# Patient Record
Sex: Female | Born: 1980 | Race: White | Hispanic: No | Marital: Married | Smoking: Never smoker
Health system: Southern US, Community
[De-identification: ages and names within clinical notes are randomized; demographics above are authoritative.]

## PROBLEM LIST (undated history)

## (undated) ENCOUNTER — Inpatient Hospital Stay (HOSPITAL_COMMUNITY): Payer: Self-pay

## (undated) DIAGNOSIS — F419 Anxiety disorder, unspecified: Secondary | ICD-10-CM

## (undated) DIAGNOSIS — Z8619 Personal history of other infectious and parasitic diseases: Secondary | ICD-10-CM

## (undated) DIAGNOSIS — K219 Gastro-esophageal reflux disease without esophagitis: Secondary | ICD-10-CM

## (undated) HISTORY — PX: WISDOM TOOTH EXTRACTION: SHX21

## (undated) HISTORY — DX: Personal history of other infectious and parasitic diseases: Z86.19

---

## 2010-12-04 LAB — ANTIBODY SCREEN: Antibody Screen: NEGATIVE

## 2010-12-04 LAB — GC/CHLAMYDIA PROBE AMP, GENITAL
Chlamydia: NEGATIVE
Gonorrhea: NEGATIVE

## 2010-12-04 LAB — CBC
HCT: 41 % (ref 36–46)
Hemoglobin: 13.5 g/dL (ref 12.0–16.0)

## 2010-12-04 LAB — ABO/RH: RH Type: POSITIVE

## 2010-12-04 LAB — HIV ANTIBODY (ROUTINE TESTING W REFLEX): HIV: NONREACTIVE

## 2011-06-14 ENCOUNTER — Inpatient Hospital Stay (HOSPITAL_COMMUNITY): Admission: AD | Admit: 2011-06-14 | Payer: Self-pay | Source: Ambulatory Visit | Admitting: Obstetrics and Gynecology

## 2011-07-02 ENCOUNTER — Encounter (HOSPITAL_COMMUNITY): Payer: Self-pay | Admitting: *Deleted

## 2011-07-02 ENCOUNTER — Telehealth (HOSPITAL_COMMUNITY): Payer: Self-pay | Admitting: *Deleted

## 2011-07-02 NOTE — Telephone Encounter (Signed)
Preadmission screen  

## 2011-07-05 ENCOUNTER — Encounter (HOSPITAL_COMMUNITY): Payer: Self-pay | Admitting: *Deleted

## 2011-07-08 ENCOUNTER — Inpatient Hospital Stay (HOSPITAL_COMMUNITY)
Admission: RE | Admit: 2011-07-08 | Discharge: 2011-07-12 | DRG: 371 | Disposition: A | Discharge status: Home / Self Care | Payer: BC Managed Care – PPO | Source: Ambulatory Visit | Attending: Obstetrics and Gynecology | Admitting: Obstetrics and Gynecology

## 2011-07-08 ENCOUNTER — Inpatient Hospital Stay (HOSPITAL_COMMUNITY): Payer: BC Managed Care – PPO

## 2011-07-08 ENCOUNTER — Encounter (HOSPITAL_COMMUNITY): Payer: Self-pay

## 2011-07-08 DIAGNOSIS — O330 Maternal care for disproportion due to deformity of maternal pelvic bones: Secondary | ICD-10-CM | POA: Diagnosis present

## 2011-07-08 DIAGNOSIS — O480 Post-term pregnancy: Principal | ICD-10-CM | POA: Diagnosis present

## 2011-07-08 DIAGNOSIS — O4100X0 Oligohydramnios, unspecified trimester, not applicable or unspecified: Secondary | ICD-10-CM | POA: Diagnosis present

## 2011-07-08 DIAGNOSIS — O339 Maternal care for disproportion, unspecified: Secondary | ICD-10-CM | POA: Diagnosis present

## 2011-07-08 LAB — CBC
HCT: 39.2 % (ref 36.0–46.0)
Hemoglobin: 13.1 g/dL (ref 12.0–15.0)
MCH: 30.8 pg (ref 26.0–34.0)
MCHC: 33.4 g/dL (ref 30.0–36.0)
MCV: 92.2 fL (ref 78.0–100.0)

## 2011-07-08 MED ORDER — ONDANSETRON HCL 4 MG/2ML IJ SOLN: 4.0000 mg | Freq: Four times a day (QID) | INTRAMUSCULAR | Status: DC | PRN

## 2011-07-08 MED ORDER — IBUPROFEN 600 MG PO TABS: 600.0000 mg | ORAL_TABLET | Freq: Four times a day (QID) | ORAL | Status: DC | PRN

## 2011-07-08 MED ORDER — OXYTOCIN 20 UNITS IN LACTATED RINGERS INFUSION - SIMPLE
1.0000 m[IU]/min | INTRAVENOUS | Status: DC
  Administered 2011-07-08: 1 m[IU]/min via INTRAVENOUS

## 2011-07-08 MED ORDER — LIDOCAINE HCL (PF) 1 % IJ SOLN: 30.0000 mL | INTRAMUSCULAR | Status: DC | PRN

## 2011-07-08 MED ORDER — TERBUTALINE SULFATE 1 MG/ML IJ SOLN: 0.2500 mg | Freq: Once | INTRAMUSCULAR | Status: AC | PRN

## 2011-07-08 MED ORDER — OXYTOCIN BOLUS FROM INFUSION
500.0000 mL | Freq: Once | INTRAVENOUS | Status: DC
  Filled 2011-07-08: qty 500

## 2011-07-08 MED ORDER — OXYCODONE-ACETAMINOPHEN 5-325 MG PO TABS: 2.0000 | ORAL_TABLET | ORAL | Status: DC | PRN

## 2011-07-08 MED ORDER — OXYTOCIN 20 UNITS IN LACTATED RINGERS INFUSION - SIMPLE
125.0000 mL/h | Freq: Once | INTRAVENOUS | Status: DC
  Filled 2011-07-08: qty 1000

## 2011-07-08 MED ORDER — LACTATED RINGERS IV SOLN
INTRAVENOUS | Status: DC
  Administered 2011-07-08 – 2011-07-09 (×4): via INTRAVENOUS

## 2011-07-08 MED ORDER — DINOPROSTONE 10 MG VA INST
10.0000 mg | VAGINAL_INSERT | Freq: Once | VAGINAL | Status: DC
  Filled 2011-07-08: qty 1

## 2011-07-08 MED ORDER — CITRIC ACID-SODIUM CITRATE 334-500 MG/5ML PO SOLN
30.0000 mL | ORAL | Status: DC | PRN
  Administered 2011-07-10: 30 mL via ORAL
  Filled 2011-07-08: qty 15

## 2011-07-08 MED ORDER — LACTATED RINGERS IV SOLN
500.0000 mL | INTRAVENOUS | Status: DC | PRN
  Administered 2011-07-08: 300 mL via INTRAVENOUS
  Administered 2011-07-09: 500 mL via INTRAVENOUS

## 2011-07-08 MED ORDER — ZOLPIDEM TARTRATE 10 MG PO TABS: 10.0000 mg | ORAL_TABLET | Freq: Every evening | ORAL | Status: DC | PRN

## 2011-07-08 MED ORDER — FLEET ENEMA 7-19 GM/118ML RE ENEM: 1.0000 | ENEMA | RECTAL | Status: DC | PRN

## 2011-07-08 MED ORDER — ACETAMINOPHEN 325 MG PO TABS: 650.0000 mg | ORAL_TABLET | ORAL | Status: DC | PRN

## 2011-07-08 NOTE — Progress Notes (Signed)
MD notified of ultrasound results. Pitocin is going to be ordered at this time and increased by 1 milliunit until 6 milliunits is reached.

## 2011-07-08 NOTE — Progress Notes (Signed)
The nurse called me to review the tracing. FHR baseline 150s  There is moderate variability Possible decelerations I have advised NPO Last full meal at 730 pm Bpp now AFI Nurse will call me with that report

## 2011-07-08 NOTE — H&P (Signed)
31 year old G 1 P 0 at 13 4/7 admitted for post dates induction. PNC unmarkable Shortly after admission, there was some concern about variable decelerations and the cervidil was held. BPP was 6 / 8 AFI 2  GBBS is negative  Afebrile VSS Fetal heart rate is now reactive Good variability Cervix is 50    %  1 cm  Impression Post dates Oligohydramnios  Plan Will try low dose piton overnight Monitor FHR closely

## 2011-07-08 NOTE — Progress Notes (Signed)
Bpp report b nurse Melissa AFI is 2 Oligohydramnios would explain earlier tracing Tracing now reactive Will initiate low dose pitocin  continous monitoring

## 2011-07-08 NOTE — Progress Notes (Signed)
MD reviewed strip. Orders received for patient to go to ultrasound for a BPP and AFI. Patient to be NPO at this time.

## 2011-07-09 MED ORDER — PHENYLEPHRINE 40 MCG/ML (10ML) SYRINGE FOR IV PUSH (FOR BLOOD PRESSURE SUPPORT)
80.0000 ug | PREFILLED_SYRINGE | INTRAVENOUS | Status: DC | PRN
  Filled 2011-07-09: qty 5

## 2011-07-09 MED ORDER — LIDOCAINE HCL 1.5 % IJ SOLN
INTRAMUSCULAR | Status: DC | PRN
  Administered 2011-07-09 (×3): 4 mL via INTRADERMAL

## 2011-07-09 MED ORDER — DIPHENHYDRAMINE HCL 50 MG/ML IJ SOLN: 12.5000 mg | INTRAMUSCULAR | Status: DC | PRN

## 2011-07-09 MED ORDER — EPHEDRINE 5 MG/ML INJ
10.0000 mg | INTRAVENOUS | Status: DC | PRN
  Filled 2011-07-09: qty 4

## 2011-07-09 MED ORDER — LACTATED RINGERS IV SOLN: 500.0000 mL | Freq: Once | INTRAVENOUS | Status: DC

## 2011-07-09 MED ORDER — BUTORPHANOL TARTRATE 2 MG/ML IJ SOLN
1.0000 mg | Freq: Once | INTRAMUSCULAR | Status: AC
  Administered 2011-07-09: 1 mg via INTRAVENOUS
  Filled 2011-07-09 (×2): qty 1

## 2011-07-09 MED ORDER — PROMETHAZINE HCL 25 MG/ML IJ SOLN
12.5000 mg | Freq: Once | INTRAMUSCULAR | Status: AC
  Administered 2011-07-09: 12.5 mg via INTRAVENOUS
  Filled 2011-07-09 (×2): qty 1

## 2011-07-09 MED ORDER — EPHEDRINE 5 MG/ML INJ: 10.0000 mg | INTRAVENOUS | Status: DC | PRN

## 2011-07-09 MED ORDER — OXYTOCIN 20 UNITS IN LACTATED RINGERS INFUSION - SIMPLE: 1.0000 m[IU]/min | INTRAVENOUS | Status: DC

## 2011-07-09 MED ORDER — FENTANYL 2.5 MCG/ML BUPIVACAINE 1/10 % EPIDURAL INFUSION (WH - ANES)
14.0000 mL/h | INTRAMUSCULAR | Status: DC
  Administered 2011-07-09 (×3): 14 mL/h via EPIDURAL
  Filled 2011-07-09 (×3): qty 60

## 2011-07-09 MED ORDER — PHENYLEPHRINE 40 MCG/ML (10ML) SYRINGE FOR IV PUSH (FOR BLOOD PRESSURE SUPPORT): 80.0000 ug | PREFILLED_SYRINGE | INTRAVENOUS | Status: DC | PRN

## 2011-07-09 NOTE — Progress Notes (Signed)
Now 1/25/vtx, ISE for AROM>>clear, will ^pit per prtotocol

## 2011-07-09 NOTE — Progress Notes (Signed)
On pit  10 mu/min, still 6 90  =1, IUPC placed to adjust pit, stable FHR

## 2011-07-09 NOTE — Anesthesia Preprocedure Evaluation (Signed)
Anesthesia Evaluation  Patient identified by MRN, date of birth, ID band Patient awake    Reviewed: Allergy & Precautions, H&P , NPO status , Patient's Chart, lab work & pertinent test results, reviewed documented beta blocker date and time   History of Anesthesia Complications Negative for: history of anesthetic complications  Airway Mallampati: II TM Distance: >3 FB Neck ROM: full    Dental  (+) Teeth Intact   Pulmonary neg pulmonary ROS,  clear to auscultation        Cardiovascular neg cardio ROS regular Normal    Neuro/Psych  Headaches (decreased frequency in pregnancy), Negative Psych ROS   GI/Hepatic negative GI ROS, Neg liver ROS,   Endo/Other  Negative Endocrine ROS  Renal/GU negative Renal ROS     Musculoskeletal   Abdominal   Peds  Hematology negative hematology ROS (+)   Anesthesia Other Findings   Reproductive/Obstetrics (+) Pregnancy                           Anesthesia Physical Anesthesia Plan  ASA: II  Anesthesia Plan: Epidural   Post-op Pain Management:    Induction:   Airway Management Planned:   Additional Equipment:   Intra-op Plan:   Post-operative Plan:   Informed Consent: I have reviewed the patients History and Physical, chart, labs and discussed the procedure including the risks, benefits and alternatives for the proposed anesthesia with the patient or authorized representative who has indicated his/her understanding and acceptance.     Plan Discussed with:   Anesthesia Plan Comments:         Anesthesia Quick Evaluation

## 2011-07-09 NOTE — Anesthesia Procedure Notes (Signed)
Epidural Patient location during procedure: OB Start time: 07/09/2011 3:26 PM Reason for block: procedure for pain  Staffing Performed by: anesthesiologist   Preanesthetic Checklist Completed: patient identified, site marked, surgical consent, pre-op evaluation, timeout performed, IV checked, risks and benefits discussed and monitors and equipment checked  Epidural Patient position: sitting Prep: site prepped and draped and DuraPrep Patient monitoring: continuous pulse ox and blood pressure Approach: midline Injection technique: LOR air  Needle:  Needle type: Tuohy  Needle gauge: 17 G Needle length: 9 cm Needle insertion depth: 5 cm cm Catheter type: closed end flexible Catheter size: 19 Gauge Catheter at skin depth: 10 cm Test dose: negative  Assessment Events: blood not aspirated, injection not painful, no injection resistance, negative IV test and no paresthesia  Additional Notes Discussed risk of headache, infection, bleeding, nerve injury and failed or incomplete block.  Patient voices understanding and wishes to proceed.

## 2011-07-09 NOTE — Progress Notes (Signed)
Comfortable w/ epid, pit on 14mu/min, stable FHR with cx 4/90/-2

## 2011-07-10 ENCOUNTER — Encounter (HOSPITAL_COMMUNITY)
Admission: RE | Disposition: A | Discharge status: Home / Self Care | Payer: Self-pay | Source: Ambulatory Visit | Attending: Obstetrics and Gynecology

## 2011-07-10 ENCOUNTER — Encounter (HOSPITAL_COMMUNITY): Payer: Self-pay | Admitting: Anesthesiology

## 2011-07-10 ENCOUNTER — Encounter (HOSPITAL_COMMUNITY): Payer: Self-pay

## 2011-07-10 ENCOUNTER — Other Ambulatory Visit: Payer: Self-pay | Admitting: Obstetrics and Gynecology

## 2011-07-10 ENCOUNTER — Inpatient Hospital Stay (HOSPITAL_COMMUNITY): Payer: BC Managed Care – PPO | Admitting: Anesthesiology

## 2011-07-10 SURGERY — Surgical Case
Anesthesia: Epidural | Site: Abdomen | Wound class: Clean Contaminated

## 2011-07-10 MED ORDER — GENTAMICIN IN SALINE 1.2-0.9 MG/ML-% IV SOLN
60.0000 mg | Freq: Once | INTRAVENOUS | Status: AC
  Administered 2011-07-10: 60 mg via INTRAVENOUS
  Filled 2011-07-10: qty 50

## 2011-07-10 MED ORDER — MORPHINE SULFATE 0.5 MG/ML IJ SOLN
INTRAMUSCULAR | Status: AC
  Filled 2011-07-10: qty 10

## 2011-07-10 MED ORDER — SODIUM BICARBONATE 8.4 % IV SOLN
INTRAVENOUS | Status: AC
  Filled 2011-07-10: qty 50

## 2011-07-10 MED ORDER — MEPERIDINE HCL 25 MG/ML IJ SOLN: 6.2500 mg | INTRAMUSCULAR | Status: DC | PRN

## 2011-07-10 MED ORDER — SCOPOLAMINE 1 MG/3DAYS TD PT72
MEDICATED_PATCH | TRANSDERMAL | Status: AC
  Administered 2011-07-10: 1.5 mg via TRANSDERMAL
  Filled 2011-07-10: qty 1

## 2011-07-10 MED ORDER — SODIUM CHLORIDE 0.9 % IJ SOLN: 3.0000 mL | INTRAMUSCULAR | Status: DC | PRN

## 2011-07-10 MED ORDER — OXYTOCIN 20 UNITS IN LACTATED RINGERS INFUSION - SIMPLE: 1.0000 m[IU]/min | INTRAVENOUS | Status: DC

## 2011-07-10 MED ORDER — WITCH HAZEL-GLYCERIN EX PADS: 1.0000 "application " | MEDICATED_PAD | CUTANEOUS | Status: DC | PRN

## 2011-07-10 MED ORDER — MENTHOL 3 MG MT LOZG: 1.0000 | LOZENGE | OROMUCOSAL | Status: DC | PRN

## 2011-07-10 MED ORDER — PHENYLEPHRINE 40 MCG/ML (10ML) SYRINGE FOR IV PUSH (FOR BLOOD PRESSURE SUPPORT)
PREFILLED_SYRINGE | INTRAVENOUS | Status: AC
  Filled 2011-07-10: qty 10

## 2011-07-10 MED ORDER — DIBUCAINE 1 % RE OINT: 1.0000 "application " | TOPICAL_OINTMENT | RECTAL | Status: DC | PRN

## 2011-07-10 MED ORDER — MEASLES, MUMPS & RUBELLA VAC ~~LOC~~ INJ
0.5000 mL | INJECTION | Freq: Once | SUBCUTANEOUS | Status: DC
  Filled 2011-07-10: qty 0.5

## 2011-07-10 MED ORDER — KETOROLAC TROMETHAMINE 60 MG/2ML IM SOLN
INTRAMUSCULAR | Status: AC
  Filled 2011-07-10: qty 2

## 2011-07-10 MED ORDER — NALBUPHINE SYRINGE 5 MG/0.5 ML
5.0000 mg | INJECTION | INTRAMUSCULAR | Status: DC | PRN
  Filled 2011-07-10: qty 1

## 2011-07-10 MED ORDER — NALOXONE HCL 0.4 MG/ML IJ SOLN: 0.4000 mg | INTRAMUSCULAR | Status: DC | PRN

## 2011-07-10 MED ORDER — KETOROLAC TROMETHAMINE 30 MG/ML IJ SOLN: 15.0000 mg | Freq: Once | INTRAMUSCULAR | Status: DC | PRN

## 2011-07-10 MED ORDER — TETANUS-DIPHTH-ACELL PERTUSSIS 5-2.5-18.5 LF-MCG/0.5 IM SUSP: 0.5000 mL | Freq: Once | INTRAMUSCULAR | Status: DC

## 2011-07-10 MED ORDER — DIPHENHYDRAMINE HCL 25 MG PO CAPS
25.0000 mg | ORAL_CAPSULE | ORAL | Status: DC | PRN
Start: 2011-07-10 — End: 2011-07-12

## 2011-07-10 MED ORDER — LIDOCAINE-EPINEPHRINE (PF) 2 %-1:200000 IJ SOLN
INTRAMUSCULAR | Status: AC
  Filled 2011-07-10: qty 20

## 2011-07-10 MED ORDER — METRONIDAZOLE IN NACL 5-0.79 MG/ML-% IV SOLN
500.0000 mg | Freq: Once | INTRAVENOUS | Status: AC
  Administered 2011-07-10: 500 g via INTRAVENOUS
  Filled 2011-07-10: qty 100

## 2011-07-10 MED ORDER — KETOROLAC TROMETHAMINE 30 MG/ML IJ SOLN: 30.0000 mg | Freq: Four times a day (QID) | INTRAMUSCULAR | Status: AC | PRN

## 2011-07-10 MED ORDER — SCOPOLAMINE 1 MG/3DAYS TD PT72: 1.0000 | MEDICATED_PATCH | Freq: Once | TRANSDERMAL | Status: DC

## 2011-07-10 MED ORDER — ONDANSETRON HCL 4 MG/2ML IJ SOLN: 4.0000 mg | INTRAMUSCULAR | Status: DC | PRN

## 2011-07-10 MED ORDER — HYDROMORPHONE HCL PF 1 MG/ML IJ SOLN
INTRAMUSCULAR | Status: AC
  Filled 2011-07-10: qty 1

## 2011-07-10 MED ORDER — OXYTOCIN 20 UNITS IN LACTATED RINGERS INFUSION - SIMPLE
125.0000 mL/h | INTRAVENOUS | Status: AC
  Administered 2011-07-10: 125 mL/h via INTRAVENOUS
  Filled 2011-07-10: qty 1000

## 2011-07-10 MED ORDER — ONDANSETRON HCL 4 MG/2ML IJ SOLN: 4.0000 mg | Freq: Three times a day (TID) | INTRAMUSCULAR | Status: DC | PRN

## 2011-07-10 MED ORDER — MORPHINE SULFATE (PF) 0.5 MG/ML IJ SOLN
INTRAMUSCULAR | Status: DC | PRN
  Administered 2011-07-10: 1 mg via EPIDURAL
  Administered 2011-07-10: 1 mg via INTRAVENOUS
  Administered 2011-07-10: 3 mg via EPIDURAL

## 2011-07-10 MED ORDER — SODIUM CHLORIDE 0.9 % IJ SOLN: 3.0000 mL | Freq: Two times a day (BID) | INTRAMUSCULAR | Status: DC

## 2011-07-10 MED ORDER — TERBUTALINE SULFATE 1 MG/ML IJ SOLN: 0.2500 mg | Freq: Once | INTRAMUSCULAR | Status: DC | PRN

## 2011-07-10 MED ORDER — SODIUM BICARBONATE 8.4 % IV SOLN
INTRAVENOUS | Status: DC | PRN
  Administered 2011-07-10: 5 mL via EPIDURAL

## 2011-07-10 MED ORDER — OXYCODONE-ACETAMINOPHEN 5-325 MG PO TABS
1.0000 | ORAL_TABLET | Freq: Four times a day (QID) | ORAL | Status: DC | PRN
  Administered 2011-07-12: 1 via ORAL
  Filled 2011-07-10: qty 1

## 2011-07-10 MED ORDER — FENTANYL 2.5 MCG/ML BUPIVACAINE 1/10 % EPIDURAL INFUSION (WH - ANES): 14.0000 mL/h | INTRAMUSCULAR | Status: DC

## 2011-07-10 MED ORDER — PHENYLEPHRINE 40 MCG/ML (10ML) SYRINGE FOR IV PUSH (FOR BLOOD PRESSURE SUPPORT): 80.0000 ug | PREFILLED_SYRINGE | INTRAVENOUS | Status: DC | PRN

## 2011-07-10 MED ORDER — PROMETHAZINE HCL 25 MG/ML IJ SOLN: 6.2500 mg | INTRAMUSCULAR | Status: DC | PRN

## 2011-07-10 MED ORDER — HYDROMORPHONE HCL PF 1 MG/ML IJ SOLN: 0.2500 mg | INTRAMUSCULAR | Status: DC | PRN

## 2011-07-10 MED ORDER — DIPHENHYDRAMINE HCL 50 MG/ML IJ SOLN: 25.0000 mg | INTRAMUSCULAR | Status: DC | PRN

## 2011-07-10 MED ORDER — SODIUM CHLORIDE 0.9 % IV SOLN: 250.0000 mL | INTRAVENOUS | Status: DC | PRN

## 2011-07-10 MED ORDER — LANOLIN HYDROUS EX OINT: 1.0000 "application " | TOPICAL_OINTMENT | CUTANEOUS | Status: DC | PRN

## 2011-07-10 MED ORDER — GENTAMICIN IN SALINE 1.2-0.9 MG/ML-% IV SOLN: 60.0000 mg | Freq: Three times a day (TID) | INTRAVENOUS | Status: DC

## 2011-07-10 MED ORDER — ONDANSETRON HCL 4 MG/2ML IJ SOLN
INTRAMUSCULAR | Status: DC | PRN
  Administered 2011-07-10: 4 mg via INTRAVENOUS

## 2011-07-10 MED ORDER — LACTATED RINGERS IV SOLN: 500.0000 mL | Freq: Once | INTRAVENOUS | Status: DC

## 2011-07-10 MED ORDER — FLEET ENEMA 7-19 GM/118ML RE ENEM: 1.0000 | ENEMA | Freq: Every day | RECTAL | Status: DC | PRN

## 2011-07-10 MED ORDER — DIPHENHYDRAMINE HCL 50 MG/ML IJ SOLN
12.5000 mg | INTRAMUSCULAR | Status: DC | PRN
Start: 2011-07-10 — End: 2011-07-10

## 2011-07-10 MED ORDER — PHENYLEPHRINE HCL 10 MG/ML IJ SOLN
INTRAMUSCULAR | Status: DC | PRN
  Administered 2011-07-10: 80 ug via INTRAVENOUS
  Administered 2011-07-10: 120 ug via INTRAVENOUS

## 2011-07-10 MED ORDER — METRONIDAZOLE IN NACL 5-0.79 MG/ML-% IV SOLN
500.0000 mg | Freq: Three times a day (TID) | INTRAVENOUS | Status: DC
  Administered 2011-07-10 – 2011-07-11 (×4): 500 mg via INTRAVENOUS
  Filled 2011-07-10 (×4): qty 100

## 2011-07-10 MED ORDER — GENTAMICIN SULFATE 40 MG/ML IJ SOLN
160.0000 mg | Freq: Three times a day (TID) | INTRAVENOUS | Status: DC
  Administered 2011-07-10 – 2011-07-11 (×4): 160 mg via INTRAVENOUS
  Filled 2011-07-10 (×5): qty 4

## 2011-07-10 MED ORDER — EPHEDRINE 5 MG/ML INJ: 10.0000 mg | INTRAVENOUS | Status: DC | PRN

## 2011-07-10 MED ORDER — SIMETHICONE 80 MG PO CHEW
80.0000 mg | CHEWABLE_TABLET | Freq: Three times a day (TID) | ORAL | Status: DC
  Administered 2011-07-10 – 2011-07-11 (×8): 80 mg via ORAL

## 2011-07-10 MED ORDER — OXYTOCIN 20 UNITS IN LACTATED RINGERS INFUSION - SIMPLE
INTRAVENOUS | Status: DC | PRN
  Administered 2011-07-10: 20 [IU] via INTRAVENOUS

## 2011-07-10 MED ORDER — ZOLPIDEM TARTRATE 5 MG PO TABS: 5.0000 mg | ORAL_TABLET | Freq: Every evening | ORAL | Status: DC | PRN

## 2011-07-10 MED ORDER — 0.9 % SODIUM CHLORIDE (POUR BTL) OPTIME
TOPICAL | Status: DC | PRN
  Administered 2011-07-10: 1000 mL

## 2011-07-10 MED ORDER — ONDANSETRON HCL 4 MG/2ML IJ SOLN
INTRAMUSCULAR | Status: AC
  Filled 2011-07-10: qty 2

## 2011-07-10 MED ORDER — DIPHENHYDRAMINE HCL 50 MG/ML IJ SOLN: 12.5000 mg | INTRAMUSCULAR | Status: DC | PRN

## 2011-07-10 MED ORDER — KETOROLAC TROMETHAMINE 60 MG/2ML IM SOLN
60.0000 mg | Freq: Once | INTRAMUSCULAR | Status: AC | PRN
  Administered 2011-07-10: 60 mg via INTRAMUSCULAR

## 2011-07-10 MED ORDER — OXYTOCIN 10 UNIT/ML IJ SOLN
INTRAMUSCULAR | Status: AC
  Filled 2011-07-10: qty 2

## 2011-07-10 MED ORDER — BISACODYL 10 MG RE SUPP: 10.0000 mg | Freq: Every day | RECTAL | Status: DC | PRN

## 2011-07-10 MED ORDER — LACTATED RINGERS IV SOLN
INTRAVENOUS | Status: DC | PRN
  Administered 2011-07-10: 02:00:00 via INTRAVENOUS

## 2011-07-10 MED ORDER — FENTANYL CITRATE 0.05 MG/ML IJ SOLN
INTRAMUSCULAR | Status: AC
  Filled 2011-07-10: qty 2

## 2011-07-10 MED ORDER — ONDANSETRON HCL 4 MG PO TABS: 4.0000 mg | ORAL_TABLET | ORAL | Status: DC | PRN

## 2011-07-10 MED ORDER — IBUPROFEN 800 MG PO TABS
800.0000 mg | ORAL_TABLET | Freq: Three times a day (TID) | ORAL | Status: DC | PRN
  Administered 2011-07-10 – 2011-07-12 (×5): 800 mg via ORAL
  Filled 2011-07-10 (×5): qty 1

## 2011-07-10 MED ORDER — IBUPROFEN 600 MG PO TABS: 600.0000 mg | ORAL_TABLET | Freq: Four times a day (QID) | ORAL | Status: DC | PRN

## 2011-07-10 MED ORDER — SODIUM CHLORIDE 0.9 % IV SOLN
1.0000 ug/kg/h | INTRAVENOUS | Status: DC | PRN
  Filled 2011-07-10: qty 2.5

## 2011-07-10 MED ORDER — SENNOSIDES-DOCUSATE SODIUM 8.6-50 MG PO TABS
2.0000 | ORAL_TABLET | Freq: Every day | ORAL | Status: DC
  Administered 2011-07-10 – 2011-07-11 (×2): 2 via ORAL

## 2011-07-10 MED ORDER — DIPHENHYDRAMINE HCL 25 MG PO CAPS: 25.0000 mg | ORAL_CAPSULE | Freq: Four times a day (QID) | ORAL | Status: DC | PRN

## 2011-07-10 MED ORDER — PRENATAL MULTIVITAMIN CH
1.0000 | ORAL_TABLET | Freq: Every day | ORAL | Status: DC
  Administered 2011-07-10 – 2011-07-12 (×3): 1 via ORAL
  Filled 2011-07-10 (×3): qty 1

## 2011-07-10 MED ORDER — SIMETHICONE 80 MG PO CHEW: 80.0000 mg | CHEWABLE_TABLET | ORAL | Status: DC | PRN

## 2011-07-10 SURGICAL SUPPLY — 29 items
BENZOIN TINCTURE PRP APPL 2/3 (GAUZE/BANDAGES/DRESSINGS) ×2 IMPLANT
CLOTH BEACON ORANGE TIMEOUT ST (SAFETY) ×2 IMPLANT
DRESSING TELFA 8X3 (GAUZE/BANDAGES/DRESSINGS) IMPLANT
DRSG COVADERM 4X10 (GAUZE/BANDAGES/DRESSINGS) ×2 IMPLANT
ELECT REM PT RETURN 9FT ADLT (ELECTROSURGICAL) ×2
ELECTRODE REM PT RTRN 9FT ADLT (ELECTROSURGICAL) ×1 IMPLANT
EXTRACTOR VACUUM M CUP 4 TUBE (SUCTIONS) IMPLANT
GAUZE SPONGE 4X4 12PLY STRL LF (GAUZE/BANDAGES/DRESSINGS) ×4 IMPLANT
GLOVE BIO SURGEON STRL SZ7 (GLOVE) ×4 IMPLANT
GLOVE SKINSENSE NS SZ7.5 (GLOVE) ×1
GLOVE SKINSENSE NS SZ8.0 LF (GLOVE) ×1
GLOVE SKINSENSE STRL SZ7.5 (GLOVE) ×1 IMPLANT
GLOVE SKINSENSE STRL SZ8.0 LF (GLOVE) ×1 IMPLANT
GOWN PREVENTION PLUS LG XLONG (DISPOSABLE) ×6 IMPLANT
KIT ABG SYR 3ML LUER SLIP (SYRINGE) ×2 IMPLANT
NEEDLE HYPO 25X5/8 SAFETYGLIDE (NEEDLE) ×2 IMPLANT
NS IRRIG 1000ML POUR BTL (IV SOLUTION) ×2 IMPLANT
PACK C SECTION WH (CUSTOM PROCEDURE TRAY) ×2 IMPLANT
PAD ABD 7.5X8 STRL (GAUZE/BANDAGES/DRESSINGS) IMPLANT
SLEEVE SCD COMPRESS KNEE MED (MISCELLANEOUS) ×2 IMPLANT
STRIP CLOSURE SKIN 1/2X4 (GAUZE/BANDAGES/DRESSINGS) ×2 IMPLANT
SUT CHROMIC 0 CTX 36 (SUTURE) ×6 IMPLANT
SUT MON AB 4-0 PS1 27 (SUTURE) ×2 IMPLANT
SUT PDS AB 0 CT1 27 (SUTURE) ×4 IMPLANT
SUT VIC AB 3-0 CT1 27 (SUTURE) ×2
SUT VIC AB 3-0 CT1 TAPERPNT 27 (SUTURE) ×2 IMPLANT
TOWEL OR 17X24 6PK STRL BLUE (TOWEL DISPOSABLE) ×4 IMPLANT
TRAY FOLEY CATH 14FR (SET/KITS/TRAYS/PACK) ×2 IMPLANT
WATER STERILE IRR 1000ML POUR (IV SOLUTION) IMPLANT

## 2011-07-10 NOTE — Transfer of Care (Signed)
Immediate Anesthesia Transfer of Care Note  Patient: Samantha Espinoza  Procedure(s) Performed:  CESAREAN SECTION - Primary Cesarean Section Delivery Baby Girl @ [phone], Apgars 9/9  Patient Location: PACU  Anesthesia Type: Epidural  Level of Consciousness: awake, alert  and oriented  Airway & Oxygen Therapy: Patient Spontanous Breathing  Post-op Assessment: Report given to PACU RN  Post vital signs: Reviewed and stable  Complications: No apparent anesthesia complications

## 2011-07-10 NOTE — Addendum Note (Signed)
Addendum  created 07/10/11 1758 by Len Blalock, CRNA   Modules edited:Notes Section

## 2011-07-10 NOTE — Anesthesia Postprocedure Evaluation (Signed)
Anesthesia Post Note  Patient: Samantha Espinoza  Procedure(s) Performed:  CESAREAN SECTION - Primary Cesarean Section Delivery Baby Girl @ [phone], Apgars 9/9  Anesthesia type: Epidural  Patient location: PACU  Post pain: Pain level controlled  Post assessment: Post-op Vital signs reviewed  Last Vitals:  Filed Vitals:   07/10/11 0318  BP: 94/49  Pulse:   Temp: 39.2 C  Resp:     Post vital signs: Reviewed  Level of consciousness: awake  Complications: No apparent anesthesia complications

## 2011-07-10 NOTE — Progress Notes (Signed)
Subjective: Postpartum Day 0: Cesarean Delivery Patient reports tolerating PO.    Objective: Vital signs in last 24 hours: Temp:  [97.3 F (36.3 C)-102.9 F (39.4 C)] 97.6 F (36.4 C) (01/26 0944) Pulse Rate:  [25-116] 95  (01/26 0956) Resp:  [13-19] 18  (01/26 0944) BP: (92-130)/(40-80) 112/73 mmHg (01/26 0956) SpO2:  [96 %-99 %] 97 % (01/26 0944)  Physical Exam:  General: alert Lochia: appropriate Uterine Fundus: firm Incision: healing well DVT Evaluation: No evidence of DVT seen on physical exam.   Basename 07/08/11 2011  HGB 13.1  HCT 39.2    Assessment/Plan: Status post Cesarean section. Doing well postoperatively.  Continue current care.  Nneoma Harral M 07/10/2011, 11:02 AM

## 2011-07-10 NOTE — Anesthesia Postprocedure Evaluation (Signed)
  Anesthesia Post-op Note  Patient: Samantha Espinoza  Procedure(s) Performed:  CESAREAN SECTION - Primary Cesarean Section Delivery Baby Girl @ [phone], Apgars 9/9  Patient Location: PACU and Cath Lab  Anesthesia Type: Epidural  Level of Consciousness: awake, alert  and oriented  Airway and Oxygen Therapy: Patient Spontanous Breathing   Post-op Assessment: Patient's Cardiovascular Status Stable and Respiratory Function Stable  Post-op Vital Signs: stable  Complications: No apparent anesthesia complications

## 2011-07-10 NOTE — Progress Notes (Signed)
Nio change in cx despite adeq labor by IUPC, now with T=102, will start gent+flagyl Rec CS for CPD, procedure reviewed

## 2011-07-10 NOTE — Brief Op Note (Signed)
07/08/2011 - 07/10/2011  3:02 AM  PATIENT:  Samantha Espinoza  30 y.o. female  PRE-OPERATIVE DIAGNOSIS:  CPD  POST-OPERATIVE DIAGNOSIS:  CPD  PROCEDURE:  Procedure(s): CESAREAN SECTION  SURGEON:  Surgeon(s): Meriel Pica, MD  PHYSICIAN ASSISTANT:   ASSISTANTS: none   ANESTHESIA:   epidural  EBL:  Total I/O In: 300 [I.V.:300] Out: 1950 [Urine:1250; Blood:700]  BLOOD ADMINISTERED:none  DRAINS: Urinary Catheter (Foley)   LOCAL MEDICATIONS USED:  NONE  SPECIMEN:  Source of Specimen:  placenta  DISPOSITION OF SPECIMEN:  PATHOLOGY  COUNTS:  YES  TOURNIQUET:  * No tourniquets in log *  DICTATION: .Dragon Dictation  PLAN OF CARE: Admit to inpatient   PATIENT DISPOSITION:  PACU - hemodynamically stable.   Delay start of Pharmacological VTE agent (>24hrs) due to surgical blood loss or risk of bleeding:  {YES/NO/NOT APPLICABLE:20182  Preoperative diagnosis: CPD, chorioamnionitis  Postoperative diagnosis: Same  Procedure: Primary low transverse cesarean section  Surgeon: Marcelle Overlie  Anesthesia: Epidural  EBL: 700 cc  Specimens removed: Placenta to pathology  Procedure and findings:  Patient taken the operating room after an adequate level of epidural anesthesia was obtained with the patient in left tilt position the abdomen prepped and draped in the usual manner for cesarean section. In the late second stage was noted to have a temperature elevation to 102 was started preoperatively on IV gentamicin and Flagyl which will be continued. Transverse incision made 2 finger breaths above the symphysis carried down to the fascia which was incised and extended transversely. Rectus muscle divided in the midline peritoneum entered superior to without incident and extended in a vertical fashion. The vesicouterine serosa was incised and the bladder was bluntly and sharply dissected below. Transverse incision made lower segment extended with bandage scissors clear fluid noted the  patient delivered of a healthy infant Apgars were 9 and 9 cord pH was sent 7.4 the infant was suctioned cord clamped and passed the pediatric team for further care. The placenta was then delivered manually intact, sent to pathology. Uterus exteriorized cavity wiped clean with laparotomy pack closure obtained the first layer of chromic in a locked fashion followed by an imbricating layer with chromic. This is hemostatic the bladder flap area was intact and hemostatic bilateral tubes and ovaries were normal. Prior to closure sponge denies precast reported as correct x2 peritoneum was enclose a running 2-0 Vicryl suture. 2-0 Vicryl interrupted sutures used to reapproximate the rectus muscles in the midline a 0 PDS suture was then used from laterally to midline on either side to close the fascia subcutaneous tissue was irrigated noted be hemostatic a 4-0 Monocryl subcuticular suture with Steri-Strips to close the skin clear urine noted in the case mother and baby doing well that point  Dictated with dragon medical  Samantha Espinoza M. Milana Obey.D.

## 2011-07-10 NOTE — Progress Notes (Signed)
ANTIBIOTIC CONSULT NOTE - INITIAL  Pharmacy Consult for gentamicin Indication: chorioamnionitis  Allergies  Allergen Reactions  . Penicillins     Took amoxicillin and did not feel right. Never took again.    Patient Measurements: Height: 5\' 7"  (170.2 cm) Weight: 171 lb (77.565 kg) IBW/kg (Calculated) : 61.6  Adjusted Body Weight: 66.4 kg  Vital Signs: Temp: 97.5 F (36.4 C) (01/26 0444) Temp src: Oral (01/26 0444) BP: 101/64 mmHg (01/26 0444) Pulse Rate: 97  (01/26 0444) Intake/Output from previous day: 01/25 0701 - 01/26 0700 In: 750 [I.V.:750] Out: 2100 [Urine:1400; Blood:700] Intake/Output from this shift: Total I/O In: 750 [I.V.:750] Out: 2100 [Urine:1400; Blood:700]  Labs:  [facility] 07/08/11 2011  WBC 7.5  HGB 13.1  PLT 187  LABCREA --  CREATININE --   CrCl is unknown because no creatinine reading has been taken. Estimated 141ml/min peripartum for age   Microbiology: No results found for this or any previous visit (from the past 720 hour(s)).  Medical History: Past Medical History  Diagnosis Date  . History of chicken pox   . Headache     tension    Medication:   Metronidazole 500mg  q8h  Assessment:    Coverage for infection of pelvic origin.  R/O chorioamnionitis or endometritis  Goal of Therapy:    Desire gentamicin peak level to be 6-64mcg/ml & trough <55mcg/ml  Plan:  1.  Loading dose not given due to initial preop dose 60mg  @ approx 2:30-3:00am 2.  Will start maintenance regimen 160mg  q8h at 6:00am 3.  Will get serum creatinine to confirm estimated CrCl 4.  Will order actual serum gentamicin levels in 48hr if therapy continued  Alanson Hausmann, Lloyd Huger E 07/10/2011,5:33 AM

## 2011-07-11 LAB — CBC
MCH: 31.5 pg (ref 26.0–34.0)
MCV: 93.5 fL (ref 78.0–100.0)
Platelets: 126 10*3/uL — ABNORMAL LOW (ref 150–400)
RBC: 2.79 MIL/uL — ABNORMAL LOW (ref 3.87–5.11)

## 2011-07-11 MED ORDER — ERYTHROMYCIN BASE 333 MG PO TBEC
333.0000 mg | DELAYED_RELEASE_TABLET | Freq: Three times a day (TID) | ORAL | Status: DC
  Administered 2011-07-11 – 2011-07-12 (×3): 333 mg via ORAL
  Filled 2011-07-11 (×4): qty 1

## 2011-07-11 NOTE — Progress Notes (Signed)
ANTIBIOTIC CONSULT NOTE - FOLLOW UP  Pharmacy Consult for gentamicin dosing Indication: possible infection of pelvic origin / maternal fever / chorio-endometritis  Allergies  Allergen Reactions  . Penicillins     Took amoxicillin and did not feel right. Never took again.    Patient Measurements: Height: 5\' 7"  (170.2 cm) Weight: 171 lb (77.565 kg) IBW/kg (Calculated) : 61.6  Adjusted Body Weight: 66.4 kg  Vital Signs: Temp: 98.2 F (36.8 C) (01/27 0531) Temp src: Oral (01/27 0531) BP: 99/61 mmHg (01/27 0531) Pulse Rate: 75  (01/27 0531) Intake/Output from previous day: 01/26 0701 - 01/27 0700 In: 1680 [P.O.:1680] Out: 1700 [Urine:1700] Intake/Output from this shift: Total I/O In: -  Out: 1100 [Urine:1100]  Labs:  [facility] 07/11/11 0524 07/08/11 2011  WBC 12.3* 7.5  HGB 8.8* 13.1  PLT 126* 187  LABCREA -- --  CREATININE 0.69 --   Estimated Creatinine Clearance: 110.4 ml/min (by C-G formula based on Cr of 0.69).   Microbiology: No results found for this or any previous visit (from the past 720 hour(s)).  Anti-infectives     Start     Dose/Rate Route Frequency Ordered Stop   07/10/11 1000   metroNIDAZOLE (FLAGYL) IVPB 500 mg        500 mg 100 mL/hr over 60 Minutes Intravenous Every 8 hours 07/10/11 0519     07/10/11 0600   gentamicin (GARAMYCIN) 160 mg in dextrose 5 % 50 mL IVPB        160 mg 108 mL/hr over 30 Minutes Intravenous Every 8 hours 07/10/11 0542     07/10/11 0215   metroNIDAZOLE (FLAGYL) IVPB 500 mg        500 mg 100 mL/hr over 60 Minutes Intravenous  Once 07/10/11 0154 07/10/11 0221   07/10/11 0215   gentamicin (GARAMYCIN) IVPB 60 mg        60 mg 100 mL/hr over 30 Minutes Intravenous  Once 07/10/11 0207 07/10/11 0220   07/10/11 0200   gentamicin (GARAMYCIN) IVPB 60 mg  Status:  Discontinued        60 mg 100 mL/hr over 30 Minutes Intravenous Every 8 hours 07/10/11 0154 07/10/11 0207          Assessment:   Coverage for soft tissue  infection/sepsis - R/O endometritis S/P probable chorioamniotitis.  WBC slightly elevated S/P vaginal delivery, but afebrile x 24hr.  Goal of Therapy:     Present regimen gentamicin 160mg  q8h provides est peak gentamicin peak 6-6.21mcg/ml & trough <0.91mcg/ml  Plan:  1.  Continue present regimen as long as patient determined to have possible infection. 2.  Will measure actual serum gentamicin levels Tuesday morning if therapy continued to insure adequate and safe levels.  Scarlett Presto 07/11/2011,6:12 AM

## 2011-07-11 NOTE — Progress Notes (Signed)
Subjective: Postpartum Day 1: Cesarean Delivery Patient reports tolerating PO.    Objective: Vital signs in last 24 hours: Temp:  [97.8 F (36.6 C)-98.5 F (36.9 C)] 98.2 F (36.8 C) (01/27 0531) Pulse Rate:  [67-90] 75  (01/27 0531) Resp:  [18-20] 18  (01/27 0531) BP: (91-122)/(54-68) 99/61 mmHg (01/27 0531) SpO2:  [97 %-99 %] 98 % (01/27 0150)  Physical Exam:  General: alert Lochia: appropriate Uterine Fundus: firm Incision: healing well DVT Evaluation: No evidence of DVT seen on physical exam.   Basename 07/11/11 0524 07/08/11 2011  HGB 8.8* 13.1  HCT 26.1* 39.2    Assessment/Plan: Status post Cesarean section. Doing well postoperatively.  Continue current care. Change to PO ABX, poss D/C in AM [facility] M 07/11/2011, 10:23 AM

## 2011-07-12 ENCOUNTER — Encounter (HOSPITAL_COMMUNITY): Payer: Self-pay | Admitting: Obstetrics and Gynecology

## 2011-07-12 MED ORDER — ERYTHROMYCIN BASE 333 MG PO TBEC: 333.0000 mg | DELAYED_RELEASE_TABLET | Freq: Three times a day (TID) | ORAL | Status: AC

## 2011-07-12 MED ORDER — OXYCODONE-ACETAMINOPHEN 5-325 MG PO TABS: 1.0000 | ORAL_TABLET | Freq: Four times a day (QID) | ORAL | Status: AC | PRN

## 2011-07-12 MED ORDER — IBUPROFEN 800 MG PO TABS: 800.0000 mg | ORAL_TABLET | Freq: Three times a day (TID) | ORAL | Status: AC | PRN

## 2011-07-12 NOTE — Discharge Summary (Signed)
Obstetric Discharge Summary Reason for Admission: induction of labor Prenatal Procedures: ultrasound Intrapartum Procedures: cesarean: low cervical, transverse Postpartum Procedures: antibiotics Complications-Operative and Postpartum: none Hemoglobin  Date Value Range Status  07/11/2011 8.8* 12.0-15.0 (Espinoza/dL) Final     HCT  Date Value Range Status  07/11/2011 26.1* 36.0-46.0 (%) Final    Discharge Diagnoses: Term Pregnancy-delivered  Discharge Information: Date: 07/12/2011 Activity: pelvic rest Diet: routine Medications: PNV, Ibuprofen, Percocet and e-mycin Condition: stable Instructions: refer to practice specific booklet Discharge to: home   Newborn Data: Live born female  Birth Weight: 7 lb 11.8 oz ([id number] Espinoza) APGAR: 9, 9  Home with mother.  Samantha Espinoza 07/12/2011, 8:23 AM

## 2011-07-12 NOTE — Progress Notes (Signed)
Subjective: Postpartum Day 2: Cesarean Delivery Patient reports tolerating PO, + BM and no problems voiding.    Objective: Vital signs in last 24 hours: Temp:  [98.2 F (36.8 C)-98.5 F (36.9 C)] 98.2 F (36.8 C) (01/28 0603) Pulse Rate:  [90-103] 90  (01/28 0603) Resp:  [19-20] 20  (01/28 0603) BP: (107-112)/(57-69) 109/57 mmHg (01/28 0603)  Physical Exam:  General: alert and cooperative Lochia: appropriate Uterine Fundus: firm Incision: healing well DVT Evaluation: No evidence of DVT seen on physical exam.   Basename 07/11/11 0524  HGB 8.8*  HCT 26.1*    Assessment/Plan: Status post Cesarean section. Doing well postoperatively.  Discharge home with standard precautions and return to clinic in 1 week.  Alaysha Jefcoat G 07/12/2011, 7:59 AM

## 2012-01-17 LAB — OB RESULTS CONSOLE RUBELLA ANTIBODY, IGM: Rubella: IMMUNE

## 2012-07-25 ENCOUNTER — Encounter (HOSPITAL_COMMUNITY): Payer: Self-pay

## 2012-07-31 ENCOUNTER — Encounter (HOSPITAL_COMMUNITY): Payer: Self-pay

## 2012-08-01 ENCOUNTER — Encounter (HOSPITAL_COMMUNITY): Payer: Self-pay

## 2012-08-01 ENCOUNTER — Encounter (HOSPITAL_COMMUNITY)
Admission: RE | Admit: 2012-08-01 | Discharge: 2012-08-01 | Disposition: A | Discharge status: Home / Self Care | Payer: BC Managed Care – PPO | Source: Ambulatory Visit | Attending: Obstetrics and Gynecology | Admitting: Obstetrics and Gynecology

## 2012-08-01 LAB — TYPE AND SCREEN: ABO/RH(D): A POS

## 2012-08-01 LAB — CBC
HCT: 37.8 % (ref 36.0–46.0)
Hemoglobin: 12.6 g/dL (ref 12.0–15.0)
MCHC: 33.3 g/dL (ref 30.0–36.0)

## 2012-08-01 LAB — ABO/RH: ABO/RH(D): A POS

## 2012-08-01 NOTE — Patient Instructions (Addendum)
Your procedure is scheduled on:08/04/12  Enter through the Main Entrance at :6am Pick up desk phone and dial [phone] and inform us of your arrival.  Please call [phone] if you have any problems the morning of surgery.  Remember: Do not eat or drink after midnight:Thursday  Water ok until 3:30 am Friday Take these meds the morning of surgery with a sip of water:none  DO NOT wear jewelry, eye make-up, lipstick,body lotion, or dark fingernail polish. Do not shave for 48 hours prior to surgery.  If you are to be admitted after surgery, leave suitcase in car until your room has been assigned. Patients discharged on the day of surgery will not be allowed to drive home.

## 2012-08-01 NOTE — H&P (Signed)
NAMEKORENE, DULA             ACCOUNT NO.:  [id number]  MEDICAL RECORD NO.:  [id number]  LOCATION:  PERIO                         FACILITY:  [facility]  PHYSICIAN:  Zelphia Cairo, MD    DATE OF BIRTH:  06/27/1980  DATE OF ADMISSION:  05/18/2012 DATE OF DISCHARGE:                             HISTORY & PHYSICAL   HISTORY OF PRESENT ILLNESS:  A 32 year old, G2, P1, at 39+ weeks presents for repeat cesarean section.  Pregnancy has been uncomplicated.  PAST MEDICAL HISTORY:  Negative.  SURGICAL HISTORY:  C section.  SOCIAL HISTORY:  Negative for tobacco, alcohol, and drug use.  FAMILY HISTORY:  Noncontributory.  ALLERGIES:  Penicillin.  MEDICATIONS:  Prenatal vitamins.  PHYSICAL EXAMINATION:  VITAL SIGNS:  Afebrile with stable vital signs. GENERAL:  She is in no acute distress. HEART:  Regular rate, rhythm. LUNGS:  Clear bilaterally. ABDOMEN:  Gravid but nontender.  Cervix is 2 cm.  ASSESSMENT:  Full-term pregnancy with a history of prior cesarean section, declines trial of labor and desires repeat C-section.  PLAN:  Repeat cesarean section.     Zelphia Cairo, MD     GA/MEDQ  D:  08/01/2012  T:  08/01/2012  Job:  [id number]

## 2012-08-03 ENCOUNTER — Encounter (HOSPITAL_COMMUNITY): Payer: Self-pay | Admitting: Anesthesiology

## 2012-08-03 NOTE — Anesthesia Preprocedure Evaluation (Addendum)
Anesthesia Evaluation  Patient identified by MRN, date of birth, ID band Patient awake    Reviewed: Allergy & Precautions, H&P , NPO status , Patient's Chart, lab work & pertinent test results  Airway Mallampati: II TM Distance: >3 FB Neck ROM: Full    Dental  (+) Teeth Intact   Pulmonary neg pulmonary ROS,  breath sounds clear to auscultation  Pulmonary exam normal       Cardiovascular negative cardio ROS  Rhythm:Regular Rate:Normal     Neuro/Psych  Headaches, negative neurological ROS  negative psych ROS   GI/Hepatic Neg liver ROS, GERD-  Medicated and Controlled,  Endo/Other  negative endocrine ROS  Renal/GU negative Renal ROS  negative genitourinary   Musculoskeletal negative musculoskeletal ROS (+)   Abdominal   Peds  Hematology negative hematology ROS (+)   Anesthesia Other Findings   Reproductive/Obstetrics Previous C/Section                          Anesthesia Physical Anesthesia Plan  ASA: II  Anesthesia Plan: Spinal   Post-op Pain Management:    Induction:   Airway Management Planned: Natural Airway  Additional Equipment:   Intra-op Plan:   Post-operative Plan:   Informed Consent: I have reviewed the patients History and Physical, chart, labs and discussed the procedure including the risks, benefits and alternatives for the proposed anesthesia with the patient or authorized representative who has indicated his/her understanding and acceptance.   Dental advisory given  Plan Discussed with: CRNA, Anesthesiologist and Surgeon  Anesthesia Plan Comments:         Anesthesia Quick Evaluation

## 2012-08-04 ENCOUNTER — Encounter (HOSPITAL_COMMUNITY)
Admission: AD | Disposition: A | Discharge status: Home / Self Care | Payer: Self-pay | Source: Ambulatory Visit | Attending: Obstetrics and Gynecology

## 2012-08-04 ENCOUNTER — Encounter (HOSPITAL_COMMUNITY): Payer: Self-pay | Admitting: Anesthesiology

## 2012-08-04 ENCOUNTER — Inpatient Hospital Stay (HOSPITAL_COMMUNITY): Payer: BC Managed Care – PPO | Admitting: Anesthesiology

## 2012-08-04 ENCOUNTER — Encounter (HOSPITAL_COMMUNITY): Payer: Self-pay

## 2012-08-04 ENCOUNTER — Inpatient Hospital Stay (HOSPITAL_COMMUNITY)
Admission: AD | Admit: 2012-08-04 | Discharge: 2012-08-06 | DRG: 371 | Disposition: A | Discharge status: Home / Self Care | Payer: BC Managed Care – PPO | Source: Ambulatory Visit | Attending: Obstetrics and Gynecology | Admitting: Obstetrics and Gynecology

## 2012-08-04 DIAGNOSIS — D250 Submucous leiomyoma of uterus: Secondary | ICD-10-CM | POA: Diagnosis present

## 2012-08-04 DIAGNOSIS — O34219 Maternal care for unspecified type scar from previous cesarean delivery: Principal | ICD-10-CM | POA: Diagnosis present

## 2012-08-04 DIAGNOSIS — D4959 Neoplasm of unspecified behavior of other genitourinary organ: Secondary | ICD-10-CM | POA: Diagnosis present

## 2012-08-04 DIAGNOSIS — O34599 Maternal care for other abnormalities of gravid uterus, unspecified trimester: Secondary | ICD-10-CM | POA: Diagnosis present

## 2012-08-04 LAB — TYPE AND SCREEN
ABO/RH(D): A POS
Antibody Screen: NEGATIVE

## 2012-08-04 SURGERY — Surgical Case
Anesthesia: Spinal | Wound class: Clean Contaminated

## 2012-08-04 MED ORDER — DIBUCAINE 1 % RE OINT: 1.0000 "application " | TOPICAL_OINTMENT | RECTAL | Status: DC | PRN

## 2012-08-04 MED ORDER — FENTANYL CITRATE 0.05 MG/ML IJ SOLN
INTRAMUSCULAR | Status: DC | PRN
  Administered 2012-08-04: 25 ug via INTRATHECAL

## 2012-08-04 MED ORDER — KETOROLAC TROMETHAMINE 30 MG/ML IJ SOLN
INTRAMUSCULAR | Status: AC
  Administered 2012-08-04: 30 mg via INTRAVENOUS
  Filled 2012-08-04: qty 1

## 2012-08-04 MED ORDER — NALBUPHINE HCL 10 MG/ML IJ SOLN
5.0000 mg | INTRAMUSCULAR | Status: DC | PRN
  Filled 2012-08-04: qty 1

## 2012-08-04 MED ORDER — KETOROLAC TROMETHAMINE 30 MG/ML IJ SOLN: 30.0000 mg | Freq: Four times a day (QID) | INTRAMUSCULAR | Status: DC | PRN

## 2012-08-04 MED ORDER — WITCH HAZEL-GLYCERIN EX PADS: 1.0000 "application " | MEDICATED_PAD | CUTANEOUS | Status: DC | PRN

## 2012-08-04 MED ORDER — DIPHENHYDRAMINE HCL 50 MG/ML IJ SOLN: 25.0000 mg | INTRAMUSCULAR | Status: DC | PRN

## 2012-08-04 MED ORDER — SIMETHICONE 80 MG PO CHEW
80.0000 mg | CHEWABLE_TABLET | Freq: Three times a day (TID) | ORAL | Status: DC
  Administered 2012-08-04 – 2012-08-06 (×7): 80 mg via ORAL

## 2012-08-04 MED ORDER — MEPERIDINE HCL 25 MG/ML IJ SOLN: 6.2500 mg | INTRAMUSCULAR | Status: DC | PRN

## 2012-08-04 MED ORDER — SCOPOLAMINE 1 MG/3DAYS TD PT72
MEDICATED_PATCH | TRANSDERMAL | Status: AC
  Administered 2012-08-04: 1.5 mg via TRANSDERMAL
  Filled 2012-08-04: qty 1

## 2012-08-04 MED ORDER — EPHEDRINE 5 MG/ML INJ
INTRAVENOUS | Status: AC
  Filled 2012-08-04: qty 10

## 2012-08-04 MED ORDER — IBUPROFEN 600 MG PO TABS
600.0000 mg | ORAL_TABLET | Freq: Four times a day (QID) | ORAL | Status: DC | PRN
  Administered 2012-08-04: 600 mg via ORAL

## 2012-08-04 MED ORDER — METOCLOPRAMIDE HCL 5 MG/ML IJ SOLN: 10.0000 mg | Freq: Three times a day (TID) | INTRAMUSCULAR | Status: DC | PRN

## 2012-08-04 MED ORDER — OXYTOCIN 10 UNIT/ML IJ SOLN
INTRAMUSCULAR | Status: AC
  Filled 2012-08-04: qty 4

## 2012-08-04 MED ORDER — PHENYLEPHRINE 40 MCG/ML (10ML) SYRINGE FOR IV PUSH (FOR BLOOD PRESSURE SUPPORT)
PREFILLED_SYRINGE | INTRAVENOUS | Status: AC
  Filled 2012-08-04: qty 5

## 2012-08-04 MED ORDER — MEASLES, MUMPS & RUBELLA VAC ~~LOC~~ INJ: 0.5000 mL | INJECTION | Freq: Once | SUBCUTANEOUS | Status: DC

## 2012-08-04 MED ORDER — PHENYLEPHRINE HCL 10 MG/ML IJ SOLN
INTRAMUSCULAR | Status: DC | PRN
  Administered 2012-08-04: 40 ug via INTRAVENOUS
  Administered 2012-08-04: 80 ug via INTRAVENOUS
  Administered 2012-08-04 (×2): 40 ug via INTRAVENOUS
  Administered 2012-08-04: 80 ug via INTRAVENOUS
  Administered 2012-08-04: 40 ug via INTRAVENOUS

## 2012-08-04 MED ORDER — MEDROXYPROGESTERONE ACETATE 150 MG/ML IM SUSP: 150.0000 mg | INTRAMUSCULAR | Status: DC | PRN

## 2012-08-04 MED ORDER — IBUPROFEN 600 MG PO TABS
600.0000 mg | ORAL_TABLET | Freq: Four times a day (QID) | ORAL | Status: DC
  Administered 2012-08-04 – 2012-08-06 (×7): 600 mg via ORAL
  Filled 2012-08-04 (×8): qty 1

## 2012-08-04 MED ORDER — PRENATAL MULTIVITAMIN CH
1.0000 | ORAL_TABLET | Freq: Every day | ORAL | Status: DC
  Administered 2012-08-05 – 2012-08-06 (×2): 1 via ORAL
  Filled 2012-08-04 (×2): qty 1

## 2012-08-04 MED ORDER — LACTATED RINGERS IV SOLN
INTRAVENOUS | Status: DC
  Administered 2012-08-04 (×3): via INTRAVENOUS

## 2012-08-04 MED ORDER — FENTANYL CITRATE 0.05 MG/ML IJ SOLN
INTRAMUSCULAR | Status: AC
  Filled 2012-08-04: qty 2

## 2012-08-04 MED ORDER — ONDANSETRON HCL 4 MG/2ML IJ SOLN
INTRAMUSCULAR | Status: DC | PRN
  Administered 2012-08-04: 4 mg via INTRAVENOUS

## 2012-08-04 MED ORDER — MENTHOL 3 MG MT LOZG: 1.0000 | LOZENGE | OROMUCOSAL | Status: DC | PRN

## 2012-08-04 MED ORDER — NALOXONE HCL 1 MG/ML IJ SOLN
1.0000 ug/kg/h | INTRAVENOUS | Status: DC | PRN
  Filled 2012-08-04: qty 2

## 2012-08-04 MED ORDER — TETANUS-DIPHTH-ACELL PERTUSSIS 5-2.5-18.5 LF-MCG/0.5 IM SUSP: 0.5000 mL | Freq: Once | INTRAMUSCULAR | Status: DC

## 2012-08-04 MED ORDER — KETOROLAC TROMETHAMINE 30 MG/ML IJ SOLN
30.0000 mg | Freq: Four times a day (QID) | INTRAMUSCULAR | Status: DC | PRN
  Administered 2012-08-04: 30 mg via INTRAVENOUS

## 2012-08-04 MED ORDER — FENTANYL CITRATE 0.05 MG/ML IJ SOLN: 25.0000 ug | INTRAMUSCULAR | Status: DC | PRN

## 2012-08-04 MED ORDER — ONDANSETRON HCL 4 MG/2ML IJ SOLN: 4.0000 mg | Freq: Three times a day (TID) | INTRAMUSCULAR | Status: DC | PRN

## 2012-08-04 MED ORDER — ONDANSETRON HCL 4 MG/2ML IJ SOLN: 4.0000 mg | INTRAMUSCULAR | Status: DC | PRN

## 2012-08-04 MED ORDER — IBUPROFEN 600 MG PO TABS: 600.0000 mg | ORAL_TABLET | Freq: Four times a day (QID) | ORAL | Status: DC | PRN

## 2012-08-04 MED ORDER — SIMETHICONE 80 MG PO CHEW: 80.0000 mg | CHEWABLE_TABLET | ORAL | Status: DC | PRN

## 2012-08-04 MED ORDER — KETOROLAC TROMETHAMINE 30 MG/ML IJ SOLN: 30.0000 mg | Freq: Four times a day (QID) | INTRAMUSCULAR | Status: AC | PRN

## 2012-08-04 MED ORDER — EPHEDRINE SULFATE 50 MG/ML IJ SOLN
INTRAMUSCULAR | Status: DC | PRN
  Administered 2012-08-04 (×8): 10 mg via INTRAVENOUS

## 2012-08-04 MED ORDER — ONDANSETRON HCL 4 MG/2ML IJ SOLN
INTRAMUSCULAR | Status: AC
  Filled 2012-08-04: qty 2

## 2012-08-04 MED ORDER — KETOROLAC TROMETHAMINE 60 MG/2ML IM SOLN
60.0000 mg | Freq: Once | INTRAMUSCULAR | Status: AC | PRN
  Filled 2012-08-04: qty 2

## 2012-08-04 MED ORDER — OXYTOCIN 10 UNIT/ML IJ SOLN
40.0000 [IU] | INTRAVENOUS | Status: DC | PRN
  Administered 2012-08-04: 40 [IU] via INTRAVENOUS

## 2012-08-04 MED ORDER — DIPHENHYDRAMINE HCL 25 MG PO CAPS: 25.0000 mg | ORAL_CAPSULE | Freq: Four times a day (QID) | ORAL | Status: DC | PRN

## 2012-08-04 MED ORDER — DIPHENHYDRAMINE HCL 50 MG/ML IJ SOLN: 12.5000 mg | INTRAMUSCULAR | Status: DC | PRN

## 2012-08-04 MED ORDER — SCOPOLAMINE 1 MG/3DAYS TD PT72: 1.0000 | MEDICATED_PATCH | Freq: Once | TRANSDERMAL | Status: DC

## 2012-08-04 MED ORDER — CLINDAMYCIN PHOSPHATE 900 MG/50ML IV SOLN
900.0000 mg | Freq: Once | INTRAVENOUS | Status: AC
  Administered 2012-08-04: 900 mg via INTRAVENOUS
  Filled 2012-08-04: qty 50

## 2012-08-04 MED ORDER — ONDANSETRON HCL 4 MG PO TABS: 4.0000 mg | ORAL_TABLET | ORAL | Status: DC | PRN

## 2012-08-04 MED ORDER — OXYTOCIN 40 UNITS IN LACTATED RINGERS INFUSION - SIMPLE MED: 62.5000 mL/h | INTRAVENOUS | Status: AC

## 2012-08-04 MED ORDER — MORPHINE SULFATE (PF) 0.5 MG/ML IJ SOLN
INTRAMUSCULAR | Status: DC | PRN
  Administered 2012-08-04: .15 mg via INTRATHECAL

## 2012-08-04 MED ORDER — SENNOSIDES-DOCUSATE SODIUM 8.6-50 MG PO TABS
2.0000 | ORAL_TABLET | Freq: Every day | ORAL | Status: DC
  Administered 2012-08-04 – 2012-08-05 (×2): 2 via ORAL

## 2012-08-04 MED ORDER — SODIUM CHLORIDE 0.9 % IJ SOLN: 3.0000 mL | INTRAMUSCULAR | Status: DC | PRN

## 2012-08-04 MED ORDER — SCOPOLAMINE 1 MG/3DAYS TD PT72
1.0000 | MEDICATED_PATCH | Freq: Once | TRANSDERMAL | Status: DC
  Administered 2012-08-04: 1.5 mg via TRANSDERMAL

## 2012-08-04 MED ORDER — 0.9 % SODIUM CHLORIDE (POUR BTL) OPTIME
TOPICAL | Status: DC | PRN
  Administered 2012-08-04: 1000 mL

## 2012-08-04 MED ORDER — DIPHENHYDRAMINE HCL 25 MG PO CAPS: 25.0000 mg | ORAL_CAPSULE | ORAL | Status: DC | PRN

## 2012-08-04 MED ORDER — DEXTROSE IN LACTATED RINGERS 5 % IV SOLN
INTRAVENOUS | Status: DC
  Administered 2012-08-04: 18:00:00 via INTRAVENOUS

## 2012-08-04 MED ORDER — BUPIVACAINE IN DEXTROSE 0.75-8.25 % IT SOLN
INTRATHECAL | Status: DC | PRN
  Administered 2012-08-04: 1.8 mL via INTRATHECAL

## 2012-08-04 MED ORDER — NALOXONE HCL 0.4 MG/ML IJ SOLN: 0.4000 mg | INTRAMUSCULAR | Status: DC | PRN

## 2012-08-04 MED ORDER — OXYCODONE-ACETAMINOPHEN 5-325 MG PO TABS
1.0000 | ORAL_TABLET | ORAL | Status: DC | PRN
  Administered 2012-08-05 – 2012-08-06 (×5): 1 via ORAL
  Filled 2012-08-04 (×5): qty 1

## 2012-08-04 MED ORDER — MORPHINE SULFATE 0.5 MG/ML IJ SOLN
INTRAMUSCULAR | Status: AC
  Filled 2012-08-04: qty 10

## 2012-08-04 MED ORDER — LANOLIN HYDROUS EX OINT: 1.0000 "application " | TOPICAL_OINTMENT | CUTANEOUS | Status: DC | PRN

## 2012-08-04 SURGICAL SUPPLY — 37 items
CLOTH BEACON ORANGE TIMEOUT ST (SAFETY) ×2 IMPLANT
DERMABOND ADVANCED (GAUZE/BANDAGES/DRESSINGS) ×1
DERMABOND ADVANCED .7 DNX12 (GAUZE/BANDAGES/DRESSINGS) ×1 IMPLANT
DRAPE LG THREE QUARTER DISP (DRAPES) ×2 IMPLANT
DRESSING TELFA 8X3 (GAUZE/BANDAGES/DRESSINGS) ×2 IMPLANT
DRSG OPSITE POSTOP 4X10 (GAUZE/BANDAGES/DRESSINGS) ×2 IMPLANT
DURAPREP 26ML APPLICATOR (WOUND CARE) ×2 IMPLANT
ELECT REM PT RETURN 9FT ADLT (ELECTROSURGICAL) ×2
ELECTRODE REM PT RTRN 9FT ADLT (ELECTROSURGICAL) ×1 IMPLANT
EXTRACTOR VACUUM M CUP 4 TUBE (SUCTIONS) ×2 IMPLANT
GAUZE SPONGE 4X4 12PLY STRL LF (GAUZE/BANDAGES/DRESSINGS) ×2 IMPLANT
GLOVE BIO SURGEON STRL SZ 6.5 (GLOVE) ×4 IMPLANT
GLOVE BIO SURGEON STRL SZ7.5 (GLOVE) ×2 IMPLANT
GLOVE BIOGEL PI IND STRL 7.0 (GLOVE) ×1 IMPLANT
GLOVE BIOGEL PI INDICATOR 7.0 (GLOVE) ×1
GLOVE SURG SS PI 6.0 STRL IVOR (GLOVE) ×2 IMPLANT
GLOVE SURG SS PI 7.0 STRL IVOR (GLOVE) ×2 IMPLANT
GOWN PREVENTION PLUS LG XLONG (DISPOSABLE) ×6 IMPLANT
GOWN STRL REIN XL XLG (GOWN DISPOSABLE) ×2 IMPLANT
KIT ABG SYR 3ML LUER SLIP (SYRINGE) ×2 IMPLANT
NEEDLE HYPO 25X5/8 SAFETYGLIDE (NEEDLE) ×2 IMPLANT
NS IRRIG 1000ML POUR BTL (IV SOLUTION) ×2 IMPLANT
PACK C SECTION WH (CUSTOM PROCEDURE TRAY) ×2 IMPLANT
PAD ABD 7.5X8 STRL (GAUZE/BANDAGES/DRESSINGS) ×2 IMPLANT
PAD OB MATERNITY 4.3X12.25 (PERSONAL CARE ITEMS) ×2 IMPLANT
SLEEVE SCD COMPRESS KNEE MED (MISCELLANEOUS) IMPLANT
STAPLER VISISTAT 35W (STAPLE) IMPLANT
SUT CHROMIC 0 CT 802H (SUTURE) IMPLANT
SUT CHROMIC 0 CTX 36 (SUTURE) ×6 IMPLANT
SUT MON AB-0 CT1 36 (SUTURE) ×2 IMPLANT
SUT PDS AB 0 CTX 60 (SUTURE) ×2 IMPLANT
SUT PLAIN 0 NONE (SUTURE) IMPLANT
SUT VIC AB 4-0 KS 27 (SUTURE) IMPLANT
TAPE CLOTH SURG 4X10 WHT LF (GAUZE/BANDAGES/DRESSINGS) ×2 IMPLANT
TOWEL OR 17X24 6PK STRL BLUE (TOWEL DISPOSABLE) ×6 IMPLANT
TRAY FOLEY CATH 14FR (SET/KITS/TRAYS/PACK) IMPLANT
WATER STERILE IRR 1000ML POUR (IV SOLUTION) ×2 IMPLANT

## 2012-08-04 NOTE — Anesthesia Postprocedure Evaluation (Signed)
  Anesthesia Post Note  Patient: Samantha Espinoza  Procedure(s) Performed: Procedure(s) (LRB): CESAREAN SECTION (N/A)  Anesthesia type: Spinal  Patient location: PACU  Post pain: Pain level controlled  Post assessment: Post-op Vital signs reviewed  Last Vitals:  Filed Vitals:   08/04/12 0845  BP: 113/66  Pulse: 93  Temp:   Resp: 16    Post vital signs: Reviewed  Level of consciousness: awake  Complications: No apparent anesthesia complications

## 2012-08-04 NOTE — Anesthesia Procedure Notes (Signed)
Spinal  Patient location during procedure: OR Start time: 08/04/2012 7:28 AM Staffing Anesthesiologist: Kaliegh Willadsen A. Performed by: anesthesiologist  Preanesthetic Checklist Completed: patient identified, site marked, surgical consent, pre-op evaluation, timeout performed, IV checked, risks and benefits discussed and monitors and equipment checked Spinal Block Patient position: sitting Prep: site prepped and draped and DuraPrep Patient monitoring: heart rate, cardiac monitor, continuous pulse ox and blood pressure Approach: midline Location: L3-4 Injection technique: single-shot Needle Needle type: Sprotte  Needle gauge: 24 G Needle length: 9 cm Needle insertion depth: 5 cm Assessment Sensory level: T4 Additional Notes Patient tolerated procedure well. Adequate sensory level.

## 2012-08-04 NOTE — H&P (Signed)
Procedure reviewed with pt, questions answered.  Informed consent

## 2012-08-04 NOTE — Op Note (Signed)
Samantha Espinoza, Samantha Espinoza             ACCOUNT NO.:  [id number]  MEDICAL RECORD NO.:  [id number]  LOCATION:  [facility]                          FACILITY:  [facility]  PHYSICIAN:  Zelphia Cairo, MD    DATE OF BIRTH:  03/06/1981  DATE OF PROCEDURE:  08/04/2012 DATE OF DISCHARGE:                              OPERATIVE REPORT   PREOPERATIVE DIAGNOSES: 1. Intrauterine pregnancy at 39+ weeks. 2. Previous cesarean section, desires repeat.  POSTOPERATIVE DIAGNOSES: 1. Intrauterine pregnancy at 39+ weeks. 2. Previous cesarean section, desires repeat. 3. submucus fibroid  PROCEDURE:  1. Repeat low transverse cesarean delivery.   2. Removal of submucus fibroid  SURGEON:  Zelphia Cairo, MD  ANESTHESIA:  Spinal.  BLOOD LOSS:  800 mL.  FINDINGS:  Vigorous female infant with Apgars of 8 and 9, submucous fibroid on the posterior wall of the uterus.  SPECIMEN: 1. Placenta for disposal. 2. Submucous fibroid to pathology.  COMPLICATIONS:  None.  URINE OUTPUT:  Clear.  CONDITION:  Stable to recovery room.  DESCRIPTION OF PROCEDURE:  The patient was taken to the operating room. After informed consent was obtained, she was given spinal anesthesia and placed in the supine position with a left tilt.  She was prepped and draped in sterile fashion and a Foley catheter was inserted. Pfannenstiel skin incision was made with a scalpel and extended to the level of the fascia.  The fascia was incised in the midline and extended laterally using curved Mayo scissors.  Kocher clamps were used to grasp the superior and inferior portion of the fascia.  The fascia was tented upwards and dissected off the underlying rectus muscles.  There were dense adhesions noted at this point.  The peritoneum was identified and tented upwards, entered sharply with Metzenbaum scissors.  This was extended superiorly and inferiorly, with curved Metzenbaum scissors. The bladder blade was then inserted.  Vesicouterine peritoneum  was dissected off of the lower uterine segment using curved Metzenbaum scissors.  The bladder blade was reinserted.  A scalpel was used to make the uterine incision.  This was extended laterally using bandage scissors.  The membranes were ruptured for clear fluid.  Fetal vertex was brought to the uterine incision, however could not be delivered with fundal pressure alone.  Vacuum was placed on the fetal vertex and with fundal pressure and gentle pressure, the infant's head was delivered.  The mouth and nose were suctioned.  The shoulders and body followed.  The cord was clamped and cut.  The infant was taken to the awaiting pediatric staff.  The placenta was manually removed from the uterus.  The uterus was cleared of all clots and debris using a dry lap sponge.  Small submucous fibroid was noted with a small stalk on the posterior uterine wall.  This was easily removed and sent off for pathology.  The uterine incision was then closed using double-layer closure of 0 chromic.  The pelvis was copiously irrigated and the incision was reinspected and found to be hemostatic.  The peritoneum was closed with Monocryl.  The fascia was closed with a looped 0 PDS.  The skin was closed with staples.  Sponge, lap, needle, and instrument counts were correct x2.  Zelphia Cairo, MD     GA/MEDQ  D:  08/04/2012  T:  08/04/2012  Job:  [id number]

## 2012-08-04 NOTE — Transfer of Care (Signed)
Immediate Anesthesia Transfer of Care Note  Patient: Samantha Espinoza  Procedure(s) Performed: Procedure(s) with comments: CESAREAN SECTION (N/A) - REPEAT  Patient Location: PACU  Anesthesia Type:Spinal  Level of Consciousness: awake, alert  and oriented  Airway & Oxygen Therapy: Patient Spontanous Breathing  Post-op Assessment: Report given to PACU RN and Post -op Vital signs reviewed and stable  Post vital signs: Reviewed and stable  Complications: No apparent anesthesia complications

## 2012-08-04 NOTE — Anesthesia Postprocedure Evaluation (Signed)
  Anesthesia Post-op Note  Patient: Samantha Espinoza  Procedure(s) Performed: Procedure(s) with comments: CESAREAN SECTION (N/A) - REPEAT  Patient Location: Mother/Baby  Anesthesia Type:Spinal  Level of Consciousness: awake, alert  and oriented  Airway and Oxygen Therapy: Patient Spontanous Breathing  Post-op Pain: mild  Post-op Assessment: Patient's Cardiovascular Status Stable, Respiratory Function Stable, No headache, No backache, No residual numbness and No residual motor weakness  Post-op Vital Signs: stable  Complications: No apparent anesthesia complications

## 2012-08-05 LAB — CBC
HCT: 27.6 % — ABNORMAL LOW (ref 36.0–46.0)
Hemoglobin: 9.3 g/dL — ABNORMAL LOW (ref 12.0–15.0)
MCH: 31.4 pg (ref 26.0–34.0)
MCV: 93.2 fL (ref 78.0–100.0)
RBC: 2.96 MIL/uL — ABNORMAL LOW (ref 3.87–5.11)
WBC: 6.3 10*3/uL (ref 4.0–10.5)

## 2012-08-05 NOTE — Progress Notes (Signed)
Subjective: Postpartum Day 1: Cesarean Delivery Patient reports no problems voiding.    Objective: Vital signs in last 24 hours: Temp:  [97.4 F (36.3 C)-99 F (37.2 C)] 97.5 F (36.4 C) (02/22 0332) Pulse Rate:  [64-108] 69 (02/22 0332) Resp:  [15-21] 20 (02/22 0332) BP: (92-129)/(46-70) 92/52 mmHg (02/22 0332) SpO2:  [97 %-100 %] 98 % (02/22 0332)  Physical Exam:  General: alert and cooperative Lochia: appropriate Uterine Fundus: firm Incision: no significant drainage DVT Evaluation: No evidence of DVT seen on physical exam.   Recent Labs  08/05/12 0527  HGB 9.3*  HCT 27.6*    Assessment/Plan: Status post Cesarean section. Doing well postoperatively.  Continue current care.  Samantha Espinoza 08/05/2012, 6:09 AM

## 2012-08-06 MED ORDER — IBUPROFEN 600 MG PO TABS: 600.0000 mg | ORAL_TABLET | Freq: Four times a day (QID) | ORAL | Status: DC

## 2012-08-06 MED ORDER — OXYCODONE-ACETAMINOPHEN 5-325 MG PO TABS: 1.0000 | ORAL_TABLET | ORAL | Status: DC | PRN

## 2012-08-06 NOTE — Discharge Summary (Signed)
Obstetric Discharge Summary Reason for Admission: cesarean section Prenatal Procedures: ultrasound Intrapartum Procedures: cesarean: low cervical, transverse Postpartum Procedures: none Complications-Operative and Postpartum: none Hemoglobin  Date Value Range Status  08/05/2012 9.3* 12.0 - 15.0 g/dL Final     HCT  Date Value Range Status  08/05/2012 27.6* 36.0 - 46.0 % Final    Physical Exam:  General: alert and cooperative Lochia: appropriate Uterine Fundus: firm Incision: healing well DVT Evaluation: No evidence of DVT seen on physical exam.  Discharge Diagnoses: Term Pregnancy-delivered  Discharge Information: Date: 08/06/2012 Activity: pelvic rest Diet: routine Medications: PNV, Ibuprofen and Percocet Condition: stable Instructions: refer to practice specific booklet Discharge to: home Follow-up Information   Schedule an appointment as soon as possible for a visit in 1 week to follow up.      Newborn Data: Live born female  Birth Weight: 8 lb 11.3 oz (3950 g) APGAR: 8, 9  Home with mother.  Samantha Espinoza 08/06/2012, 8:39 AM

## 2012-08-07 ENCOUNTER — Encounter (HOSPITAL_COMMUNITY): Payer: Self-pay | Admitting: Obstetrics and Gynecology

## 2013-06-20 ENCOUNTER — Ambulatory Visit (INDEPENDENT_AMBULATORY_CARE_PROVIDER_SITE_OTHER): Payer: BC Managed Care – PPO | Admitting: Family Medicine

## 2013-06-20 ENCOUNTER — Ambulatory Visit: Payer: BC Managed Care – PPO

## 2013-06-20 DIAGNOSIS — M7750 Other enthesopathy of unspecified foot: Secondary | ICD-10-CM

## 2013-06-20 DIAGNOSIS — M7741 Metatarsalgia, right foot: Secondary | ICD-10-CM

## 2013-06-20 DIAGNOSIS — M79609 Pain in unspecified limb: Secondary | ICD-10-CM

## 2013-06-20 MED ORDER — MELOXICAM 15 MG PO TABS: 15.0000 mg | ORAL_TABLET | Freq: Every day | ORAL | Status: DC

## 2013-06-20 NOTE — Progress Notes (Signed)
Subjective:    Patient ID: Samantha Espinoza, female    DOB: 1980-10-23, 33 y.o.   MRN: [id number]  HPI  This chart was scribed for [facility]. Tamala Julian, MD, by Sydell Axon, ED Scribe. This patient was seen in room 11 and the patient's care was started at 5:37 PM.  HPI Comments: Samantha Espinoza is a 33 y.o. female who presents to the [facility] complaining of R foot pain that has been gradually worsening with onset one month ago. Patient states the pain is of a soreness quality until she raises her toes and the pain becomes sharp. She further reports that standing and applying pressure to her foot gives her intense pain, especially when she is on her toes. Patient states the pain is usually worse in the morning and lessens after a few hours. She denies ever having injured her foot in the past and denies any related trauma. She further denies any swelling to the area, numbness or paresthesia. Patient denies taking any medication or applying ice to relieve symptoms. She is currently taking birth control medication and denies any other mediation or chronic medical illness.  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 She confirms wearing supportive shoes and reports it feeling worse with high heels.    Past Medical History  Diagnosis Date  . History of chicken pox   . Headache(784.0)     tension  . Heartburn in pregnancy     Past Surgical History  Procedure Laterality Date  . Wisdom tooth extraction    . Cesarean section  07/10/2011    Procedure: CESAREAN SECTION;  Surgeon: Margarette Asal, MD;  Location: [facility];  Service: Gynecology;  Laterality: N/A;  Primary Cesarean Section Delivery Baby Girl @ [phone], Apgars 9/9  . Cesarean section N/A 08/04/2012    Procedure: CESAREAN SECTION;  Surgeon: Marylynn Pearson, MD;  Location: [facility];  Service: Obstetrics;  Laterality: N/A;  REPEAT    Family History  Problem Relation Age of Onset   . Hypertension Father   . Hiatal hernia Father   . Healthy Mother   . Healthy Sister   . Hypertension Maternal Grandfather   . High Cholesterol Maternal Grandfather     History   Social History  . Marital Status: Married    Spouse Name: N/A    Number of Children: N/A  . Years of Education: N/A   Occupational History  . Not on file.   Social History Main Topics  . Smoking status: Never Smoker   . Smokeless tobacco: Never Used  . Alcohol Use: No  . Drug Use: No  . Sexual Activity: Yes   Other Topics Concern  . Not on file   Social History Narrative  . No narrative on file    Allergies  Allergen Reactions  . Penicillins     Took amoxicillin and did not feel right. Never took again.    There are no active problems to display for this patient.   Current Outpatient Prescriptions on File Prior to Visit  Medication Sig Dispense Refill  . calcium carbonate (TUMS - DOSED IN MG ELEMENTAL CALCIUM) 500 MG chewable tablet Chew 2 tablets by mouth as needed for heartburn.       No current facility-administered medications on file prior to visit.    BP 110/60  Pulse 72  Temp(Src) 98.4 F (36.9 C) (Oral)  Resp 16  Ht 5'  6" (1.676 m)  Wt 150 lb (68.04 kg)  BMI 24.22 kg/m2  SpO2 100%  LMP 06/06/2013  Review of Systems  Constitutional: Negative for fever and chills.  HENT: Negative.   Respiratory: Negative.  Negative for shortness of breath.   Cardiovascular: Negative.   Gastrointestinal: Negative for nausea and vomiting.  Musculoskeletal: Positive for myalgias. Negative for joint swelling.       R foot pain  Neurological: Negative for dizziness, weakness and numbness.  All other systems reviewed and are negative.   Objective:   Physical Exam  Nursing note and vitals reviewed. Constitutional: She is oriented to person, place, and time. She appears well-developed and well-nourished. No distress.  HENT:  Head: Normocephalic and atraumatic.  Eyes: EOM are  normal. Pupils are equal, round, and reactive to light.  Neck: Normal range of motion. Neck supple.  Cardiovascular: Normal rate.   Pulmonary/Chest: Effort normal. No respiratory distress.  Musculoskeletal: Normal range of motion. She exhibits tenderness.  R 3rd 4th metatarsal TTP. Tender to R metatarsal squeeze.   Neurological: She is alert and oriented to person, place, and time.  Skin: Skin is warm and dry.  Psychiatric: She has a normal mood and affect. Her behavior is normal.   [facility] reading (PRIMARY) by  Dr. Tamala Julian.  R FOOT:  NAD   Assessment & Plan:  Pain, foot, right - Plan: DG Foot Complete Right  Metatarsalgia of right foot  1. Pain R foot: New. Rx for Meloxicam provided to use daily for one month. 2.  Metatarsalgia R foot: New.  Recommend supportive shoe for one month; avoid heels or walking barefooted.  Recommend icing foot twice daily for 15 minutes; if no improvement in one month, place in post-op shoe and consider repeat xray to rule out evolving stress fracture.    I personally performed the services described in this documentation, which was scribed in my presence.  The recorded information has been reviewed and is accurate.  Reginia Forts, M.D.  [facility] [address] [place], [place]  [phone] phone [phone] fax

## 2013-06-20 NOTE — Patient Instructions (Signed)
1.  RECOMMEND WEARING SUPPORTIVE SHOE DAILY.  AVOID WEARING HIGH HEELS OR SHOES WITHOUT SUPPORT. 2.  RECOMMEND ICING FOOT TWICE DAILY FOR 15 MINUTES EACH SESSION. 3.  AVOID PROLONGED STANDING ON CONCRETE WHEN POSSIBLE. [place].  WEAR A SUPPORTIVE SHOE. 5.  RECOMMEND ANTI-INFLAMMATORY DAILY FOR 3-4 WEEKS.

## 2014-04-07 ENCOUNTER — Emergency Department (HOSPITAL_BASED_OUTPATIENT_CLINIC_OR_DEPARTMENT_OTHER): Payer: BC Managed Care – PPO

## 2014-04-07 ENCOUNTER — Emergency Department (HOSPITAL_BASED_OUTPATIENT_CLINIC_OR_DEPARTMENT_OTHER)
Admission: EM | Admit: 2014-04-07 | Discharge: 2014-04-07 | Disposition: A | Discharge status: Home / Self Care | Payer: BC Managed Care – PPO | Attending: Emergency Medicine | Admitting: Emergency Medicine

## 2014-04-07 ENCOUNTER — Encounter (HOSPITAL_BASED_OUTPATIENT_CLINIC_OR_DEPARTMENT_OTHER): Payer: Self-pay | Admitting: Emergency Medicine

## 2014-04-07 DIAGNOSIS — R52 Pain, unspecified: Secondary | ICD-10-CM

## 2014-04-07 DIAGNOSIS — R1012 Left upper quadrant pain: Secondary | ICD-10-CM | POA: Diagnosis not present

## 2014-04-07 DIAGNOSIS — Z79899 Other long term (current) drug therapy: Secondary | ICD-10-CM | POA: Diagnosis not present

## 2014-04-07 DIAGNOSIS — K2970 Gastritis, unspecified, without bleeding: Secondary | ICD-10-CM | POA: Insufficient documentation

## 2014-04-07 DIAGNOSIS — Z3202 Encounter for pregnancy test, result negative: Secondary | ICD-10-CM | POA: Diagnosis not present

## 2014-04-07 DIAGNOSIS — Z8619 Personal history of other infectious and parasitic diseases: Secondary | ICD-10-CM | POA: Diagnosis not present

## 2014-04-07 DIAGNOSIS — R110 Nausea: Secondary | ICD-10-CM | POA: Diagnosis not present

## 2014-04-07 DIAGNOSIS — R1013 Epigastric pain: Secondary | ICD-10-CM | POA: Diagnosis not present

## 2014-04-07 LAB — COMPREHENSIVE METABOLIC PANEL
ALT: 11 U/L (ref 0–35)
AST: 11 U/L (ref 0–37)
Albumin: 3.2 g/dL — ABNORMAL LOW (ref 3.5–5.2)
Alkaline Phosphatase: 49 U/L (ref 39–117)
Anion gap: 15 (ref 5–15)
BUN: 4 mg/dL — ABNORMAL LOW (ref 6–23)
CO2: 21 mEq/L (ref 19–32)
Calcium: 8.9 mg/dL (ref 8.4–10.5)
Chloride: 104 mEq/L (ref 96–112)
Creatinine, Ser: 0.6 mg/dL (ref 0.50–1.10)
GFR calc Af Amer: >90 mL/min (ref >90)
GFR calc non Af Amer: >90 mL/min (ref >90)
GLUCOSE: 135 mg/dL — AB (ref 70–99)
POTASSIUM: 3 meq/L — AB (ref 3.7–5.3)
SODIUM: 140 meq/L (ref 137–147)
TOTAL PROTEIN: 6.6 g/dL (ref 6.0–8.3)
Total Bilirubin: 0.4 mg/dL (ref 0.3–1.2)

## 2014-04-07 LAB — CBC WITH DIFFERENTIAL/PLATELET
Basophils Absolute: 0 10*3/uL (ref 0.0–0.1)
Basophils Relative: 0 % (ref 0–1)
EOS ABS: 0 10*3/uL (ref 0.0–0.7)
Eosinophils Relative: 0 % (ref 0–5)
HCT: 37.7 % (ref 36.0–46.0)
Hemoglobin: 12.9 g/dL (ref 12.0–15.0)
LYMPHS PCT: 21 % (ref 12–46)
Lymphs Abs: 1 10*3/uL (ref 0.7–4.0)
MCH: 30.8 pg (ref 26.0–34.0)
MCHC: 34.2 g/dL (ref 30.0–36.0)
MCV: 90 fL (ref 78.0–100.0)
Monocytes Absolute: 0.5 10*3/uL (ref 0.1–1.0)
Monocytes Relative: 10 % (ref 3–12)
NEUTROS PCT: 69 % (ref 43–77)
Neutro Abs: 3.3 10*3/uL (ref 1.7–7.7)
Platelets: 204 10*3/uL (ref 150–400)
RBC: 4.19 MIL/uL (ref 3.87–5.11)
RDW: 12.3 % (ref 11.5–15.5)
WBC: 4.9 10*3/uL (ref 4.0–10.5)

## 2014-04-07 LAB — URINALYSIS, ROUTINE W REFLEX MICROSCOPIC
Bilirubin Urine: NEGATIVE
Glucose, UA: NEGATIVE mg/dL
KETONES UR: NEGATIVE mg/dL
LEUKOCYTES UA: NEGATIVE
NITRITE: NEGATIVE
PROTEIN: NEGATIVE mg/dL
Specific Gravity, Urine: 1.005 (ref 1.005–1.030)
Urobilinogen, UA: 0.2 mg/dL (ref 0.0–1.0)
pH: 6 (ref 5.0–8.0)

## 2014-04-07 LAB — URINE MICROSCOPIC-ADD ON

## 2014-04-07 LAB — LIPASE, BLOOD: Lipase: 27 U/L (ref 11–59)

## 2014-04-07 LAB — PREGNANCY, URINE: Preg Test, Ur: NEGATIVE

## 2014-04-07 MED ORDER — GI COCKTAIL ~~LOC~~: 30.0000 mL | Freq: Once | ORAL | Status: DC

## 2014-04-07 MED ORDER — MORPHINE SULFATE 2 MG/ML IJ SOLN
2.0000 mg | Freq: Once | INTRAMUSCULAR | Status: AC
  Administered 2014-04-07: 2 mg via INTRAVENOUS
  Filled 2014-04-07: qty 1

## 2014-04-07 MED ORDER — ONDANSETRON 8 MG PO TBDP: ORAL_TABLET | ORAL | Status: DC

## 2014-04-07 MED ORDER — ONDANSETRON HCL 4 MG/2ML IJ SOLN
4.0000 mg | Freq: Once | INTRAMUSCULAR | Status: AC
  Administered 2014-04-07: 4 mg via INTRAVENOUS
  Filled 2014-04-07: qty 2

## 2014-04-07 MED ORDER — OMEPRAZOLE 20 MG PO CPDR: 20.0000 mg | DELAYED_RELEASE_CAPSULE | Freq: Every day | ORAL | Status: DC

## 2014-04-07 NOTE — ED Notes (Addendum)
C/o upper mid abd pain that started Sat. States pain radiates into her back. States pain is sharp and comes and goes. C/o nausea. Denies any nausea. Denies any fevers. Remote hx of acid reflux per pt.

## 2014-04-07 NOTE — ED Notes (Signed)
Patient gave urine sample. 

## 2014-04-07 NOTE — ED Provider Notes (Signed)
CSN: [id number]     Arrival date & time 04/07/14  0219 History   First MD Initiated Contact with Patient 04/07/14 0243     Chief Complaint  Patient presents with  . Abdominal Pain     (Consider location/radiation/quality/duration/timing/severity/associated sxs/prior Treatment) Patient is a 33 y.o. female presenting with abdominal pain. The history is provided by the patient.  Abdominal Pain Pain location:  LUQ and epigastric Pain quality: cramping   Pain radiates to:  Does not radiate Pain severity:  Moderate Onset quality:  Sudden Timing:  Intermittent Progression:  Worsening Chronicity:  New Context: not alcohol use and not awakening from sleep   Relieved by:  Nothing Worsened by:  Nothing tried Ineffective treatments:  None tried Associated symptoms: nausea   Associated symptoms: no constipation, no diarrhea, no dysuria and no vomiting   Risk factors: no alcohol abuse     Past Medical History  Diagnosis Date  . History of chicken pox   . Headache(784.0)     tension  . Heartburn in pregnancy    Past Surgical History  Procedure Laterality Date  . Wisdom tooth extraction    . Cesarean section  07/10/2011    Procedure: CESAREAN SECTION;  Surgeon: Margarette Asal, MD;  Location: [facility];  Service: Gynecology;  Laterality: N/A;  Primary Cesarean Section Delivery Baby Girl @ [phone], Apgars 9/9  . Cesarean section N/A 08/04/2012    Procedure: CESAREAN SECTION;  Surgeon: Marylynn Pearson, MD;  Location: [facility];  Service: Obstetrics;  Laterality: N/A;  REPEAT   Family History  Problem Relation Age of Onset  . Hypertension Father   . Hiatal hernia Father   . Healthy Mother   . Healthy Sister   . Hypertension Maternal Grandfather   . High Cholesterol Maternal Grandfather    History  Substance Use Topics  . Smoking status: Never Smoker   . Smokeless tobacco: Never Used  . Alcohol Use: No   OB History   Grav Para Term Preterm Abortions TAB SAB Ect Mult Living   2 2 2        2      Review of Systems  Gastrointestinal: Positive for nausea and abdominal pain. Negative for vomiting, diarrhea and constipation.  Genitourinary: Negative for dysuria.  All other systems reviewed and are negative.     Allergies  Penicillins  Home Medications   Prior to Admission medications   Medication Sig Start Date End Date Taking? Authorizing Provider  calcium carbonate (TUMS - DOSED IN MG ELEMENTAL CALCIUM) 500 MG chewable tablet Chew 2 tablets by mouth as needed for heartburn.    Historical Provider, MD  meloxicam (MOBIC) 15 MG tablet Take 1 tablet (15 mg total) by mouth daily. 06/20/13   Wardell Honour, MD   BP 133/78  Pulse 97  Temp(Src) 98.5 F (36.9 C) (Oral)  Resp 21  Ht 5\' 7"  (1.702 m)  Wt 147 lb (66.679 kg)  BMI 23.02 kg/m2  SpO2 100%  LMP 03/17/2014 Physical Exam  Constitutional: She is oriented to person, place, and time. She appears well-developed and well-nourished. No distress.  HENT:  Head: Normocephalic and atraumatic.  Mouth/Throat: Oropharynx is clear and moist.  Eyes: Conjunctivae are normal. Pupils are equal, round, and reactive to light.  Neck: Normal range of motion. Neck supple.  Cardiovascular: Normal rate, regular rhythm and intact distal pulses.   Pulmonary/Chest: Effort normal and breath sounds normal. No respiratory distress. She has no wheezes. She has no rales.  Abdominal: Soft.  She exhibits no distension. Bowel sounds are increased. There is no tenderness. There is no rigidity, no rebound, no guarding, no tenderness at McBurney's point and negative Murphy's sign.  Musculoskeletal: Normal range of motion.  Neurological: She is alert and oriented to person, place, and time.  Skin: Skin is warm and dry.  Psychiatric: She has a normal mood and affect.    ED Course  Procedures (including critical care time) Labs Review Labs Reviewed  URINALYSIS, ROUTINE W REFLEX MICROSCOPIC - Abnormal; Notable for the following:    Hgb urine  dipstick MODERATE (*)    All other components within normal limits  COMPREHENSIVE METABOLIC PANEL - Abnormal; Notable for the following:    Potassium 3.0 (*)    Glucose, Bld 135 (*)    BUN 4 (*)    Albumin 3.2 (*)    All other components within normal limits  URINE MICROSCOPIC-ADD ON - Abnormal; Notable for the following:    Squamous Epithelial / LPF MANY (*)    All other components within normal limits  PREGNANCY, URINE  CBC WITH DIFFERENTIAL  LIPASE, BLOOD    Imaging Review No results found.   EKG Interpretation None      MDM   Final diagnoses:  Pain    Suspect GERD.  Will treat for same.  Strict return precautions given bland diet instructions given.  Pain is relieved will start prilosec.      Carlisle Beers, MD 04/07/14 [phone]

## 2014-04-15 ENCOUNTER — Encounter (HOSPITAL_BASED_OUTPATIENT_CLINIC_OR_DEPARTMENT_OTHER): Payer: Self-pay | Admitting: Emergency Medicine

## 2014-07-01 ENCOUNTER — Ambulatory Visit (INDEPENDENT_AMBULATORY_CARE_PROVIDER_SITE_OTHER): Payer: BC Managed Care – PPO | Admitting: Urgent Care

## 2014-07-01 VITALS — BP 114/72 | HR 90 | Temp 99.6°F | Resp 18 | Ht 66.5 in | Wt 155.0 lb

## 2014-07-01 DIAGNOSIS — M791 Myalgia, unspecified site: Secondary | ICD-10-CM

## 2014-07-01 DIAGNOSIS — R509 Fever, unspecified: Secondary | ICD-10-CM

## 2014-07-01 DIAGNOSIS — J069 Acute upper respiratory infection, unspecified: Secondary | ICD-10-CM

## 2014-07-01 DIAGNOSIS — J029 Acute pharyngitis, unspecified: Secondary | ICD-10-CM

## 2014-07-01 LAB — POCT INFLUENZA A/B
Influenza A, POC: NEGATIVE
Influenza B, POC: NEGATIVE

## 2014-07-01 LAB — POCT RAPID STREP A (OFFICE): Rapid Strep A Screen: NEGATIVE

## 2014-07-01 NOTE — Patient Instructions (Addendum)
Upper Respiratory Infection, Adult An upper respiratory infection (URI) is also sometimes known as the common cold. The upper respiratory tract includes the nose, sinuses, throat, trachea, and bronchi. Bronchi are the airways leading to the lungs. Most people improve within 1 week, but symptoms can last up to 2 weeks. A residual cough may last even longer.  CAUSES Many different viruses can infect the tissues lining the upper respiratory tract. The tissues become irritated and inflamed and often become very moist. Mucus production is also common. A cold is contagious. You can easily spread the virus to others by oral contact. This includes kissing, sharing a glass, coughing, or sneezing. Touching your mouth or nose and then touching a surface, which is then touched by another person, can also spread the virus. SYMPTOMS  Symptoms typically develop 1 to 3 days after you come in contact with a cold virus. Symptoms vary from person to person. They may include:  Runny nose.  Sneezing.  Nasal congestion.  Sinus irritation.  Sore throat.  Loss of voice (laryngitis).  Cough.  Fatigue.  Muscle aches.  Loss of appetite.  Headache.  Low-grade fever. DIAGNOSIS  You might diagnose your own cold based on familiar symptoms, since most people get a cold 2 to 3 times a year. Your caregiver can confirm this based on your exam. Most importantly, your caregiver can check that your symptoms are not due to another disease such as strep throat, sinusitis, pneumonia, asthma, or epiglottitis. Blood tests, throat tests, and X-rays are not necessary to diagnose a common cold, but they may sometimes be helpful in excluding other more serious diseases. Your caregiver will decide if any further tests are required. RISKS AND COMPLICATIONS  You may be at risk for a more severe case of the common cold if you smoke cigarettes, have chronic heart disease (such as heart failure) or lung disease (such as asthma), or if  you have a weakened immune system. The very young and very old are also at risk for more serious infections. Bacterial sinusitis, middle ear infections, and bacterial pneumonia can complicate the common cold. The common cold can worsen asthma and chronic obstructive pulmonary disease (COPD). Sometimes, these complications can require emergency medical care and may be life-threatening. PREVENTION  The best way to protect against getting a cold is to practice good hygiene. Avoid oral or hand contact with people with cold symptoms. Wash your hands often if contact occurs. There is no clear evidence that vitamin C, vitamin E, echinacea, or exercise reduces the chance of developing a cold. However, it is always recommended to get plenty of rest and practice good nutrition. TREATMENT  Treatment is directed at relieving symptoms. There is no cure. Antibiotics are not effective, because the infection is caused by a virus, not by bacteria. Treatment may include:  Increased fluid intake. Sports drinks offer valuable electrolytes, sugars, and fluids.  Breathing heated mist or steam (vaporizer or shower).  Eating chicken soup or other clear broths, and maintaining good nutrition.  Getting plenty of rest.  Using gargles or lozenges for comfort.  Controlling fevers with ibuprofen or acetaminophen as directed by your caregiver.  Increasing usage of your inhaler if you have asthma. Zinc gel and zinc lozenges, taken in the first 24 hours of the common cold, can shorten the duration and lessen the severity of symptoms. Pain medicines may help with fever, muscle aches, and throat pain. A variety of non-prescription medicines are available to treat congestion and runny nose. Your caregiver   can make recommendations and may suggest nasal or lung inhalers for other symptoms.  HOME CARE INSTRUCTIONS   Only take over-the-counter or prescription medicines for pain, discomfort, or fever as directed by your  caregiver.  Use a warm mist humidifier or inhale steam from a shower to increase air moisture. This may keep secretions moist and make it easier to breathe.  Drink enough water and fluids to keep your urine clear or pale yellow.  Rest as needed.  Return to work when your temperature has returned to normal or as your caregiver advises. You may need to stay home longer to avoid infecting others. You can also use a face mask and careful hand washing to prevent spread of the virus. SEEK MEDICAL CARE IF:   After the first few days, you feel you are getting worse rather than better.  You need your caregiver's advice about medicines to control symptoms.  You develop chills, worsening shortness of breath, or brown or red sputum. These may be signs of pneumonia.  You develop yellow or brown nasal discharge or pain in the face, especially when you bend forward. These may be signs of sinusitis.  You develop a fever, swollen neck glands, pain with swallowing, or white areas in the back of your throat. These may be signs of strep throat. SEEK IMMEDIATE MEDICAL CARE IF:   You have a fever.  You develop severe or persistent headache, ear pain, sinus pain, or chest pain.  You develop wheezing, a prolonged cough, cough up blood, or have a change in your usual mucus (if you have chronic lung disease).  You develop sore muscles or a stiff neck. Document Released: 11/24/2000 Document Revised: 08/23/2011 Document Reviewed: 09/05/2013 Exit[employer] Patient Information 2015 [employer]. This information is not intended to replace advice given to you by your health care provider. Make sure you discuss any questions you have with your health care provider.  

## 2014-07-01 NOTE — Progress Notes (Signed)
    MRN: [id number] DOB: 16-Apr-1981  Subjective:   Samantha Espinoza is a 34 y.o. female presenting for 5 day history of cold symptoms sinus drainage leading to throat pain, difficulty swallowing, also has had sinus headaches, body aches, fatigue, subjective fever, chills, throat/chest congestion. Has tried otc cold medicine without much relief. Denies sinus pain, eye pain, itchy or watery eyes, ear pain, ear drainage, cough, chest pain, chest tightness, shob, wheezing, n/v, abdominal pain, diarrhea. No obvious sick contacts, works as a Psychologist, clinical at [employer]. Of note, patient reports that she has had 2 Strep infections in the last year. Did not get flu shot this year. Has 2 kids at home, born 2013, 2014; they are asymptomatic. Denies history of seasonal allergies, asthma. Denies smoking or alcohol use. Denies any other aggravating or relieving factors, no other questions or concerns.  Samantha Espinoza has a current medication list which includes the following prescription(s): levonorgestrel-ethinyl estradiol.  She is allergic to penicillins.  Samantha Espinoza  has a past medical history of History of chicken pox; Headache(784.0); and Heartburn in pregnancy. Also  has past surgical history that includes Wisdom tooth extraction; Cesarean section (07/10/2011); and Cesarean section (N/A, 08/04/2012).  ROS As in subjective.  Objective:   Vitals: BP 114/72 mmHg  Pulse 90  Temp(Src) 99.6 F (37.6 C) (Oral)  Resp 18  Ht 5' 6.5" (1.689 m)  Wt 155 lb (70.308 kg)  BMI 24.65 kg/m2  SpO2 100%  LMP 06/04/2014  Physical Exam  Constitutional: She is oriented to person, place, and time and well-developed, well-nourished, and in no distress.  HENT:  TM's intact bilaterally, no effusions or erythema. Nasal turbinates mildly edematous, clear-yellow rhinorrhea. Postnasal drip present, without oropharyngeal exudates, tonsillar edema.  Eyes: Conjunctivae are normal. Right eye exhibits no discharge.  Left eye exhibits no discharge. No scleral icterus.  Neck: Normal range of motion.  Cardiovascular: Normal rate, regular rhythm, normal heart sounds and intact distal pulses.  Exam reveals no gallop and no friction rub.   No murmur heard. Pulmonary/Chest: Effort normal and breath sounds normal. No stridor. No respiratory distress. She has no wheezes. She has no rales. She exhibits no tenderness.  No dullness to percussion.  Abdominal: Bowel sounds are normal.  Lymphadenopathy:    She has no cervical adenopathy.  Neurological: She is alert and oriented to person, place, and time.  Skin: Skin is warm and dry. No rash noted. No erythema.  Psychiatric: Mood and affect normal.   Results for orders placed or performed in visit on 07/01/14 (from the past 24 hour(s))  POCT rapid strep A     Status: None   Collection Time: 07/01/14  2:19 PM  Result Value Ref Range   Rapid Strep A Screen Negative Negative  POCT Influenza A/B     Status: None   Collection Time: 07/01/14  2:30 PM  Result Value Ref Range   Influenza A, POC Negative    Influenza B, POC Negative    Assessment and Plan :   1. Sore throat 2. Myalgia 3. Fever and chills 4. URI (upper respiratory infection) - likely viral in etiology, advised supportive care, fluids, rest, delsym and or honey for sore throat, Mucinex for congestion - advised that if no improvement or worsening symptoms in 1 week to return to clinic for re-evaluation - strep culture pending  Jaynee Eagles, PA-C [facility] [phone] 07/01/2014 2:45 PM

## 2014-07-03 LAB — CULTURE, GROUP A STREP: Organism ID, Bacteria: NORMAL

## 2014-07-06 ENCOUNTER — Telehealth: Payer: Self-pay | Admitting: Family Medicine

## 2014-07-06 MED ORDER — IPRATROPIUM BROMIDE 0.03 % NA SOLN: 2.0000 | Freq: Two times a day (BID) | NASAL | Status: DC

## 2014-07-06 MED ORDER — AZITHROMYCIN 250 MG PO TABS: ORAL_TABLET | ORAL | Status: DC

## 2014-07-06 NOTE — Telephone Encounter (Signed)
Patient called answering service on Saturday, 07/06/14.  Evaluated five days ago; throat culture was pending.  No fever/+chills and sweats.  +HA.  +ear pain started yesterday L.  Hearing OK; no drainage from ear.  Sore throat is still present; +PND excessive.  +rhinorrhea;+nasal congestion.  Taking Mucinex D.  No sinus pressure.  Mild cough which is new.  No SOB.  Taking Ibuprofen.  No tobacco; no asthma. Symptoms have worsened in past 24 to 48 hours. A/P:  Worsening URI with concern for development of secondary sinusitis or otitis media:  New/worsening. Add Zpack, Atrovent nasal spray. Add nasal saline tid.  Continue Mucinex D and Ibuprofen PRN. Rx sent to pharmacy.

## 2014-07-07 ENCOUNTER — Encounter: Payer: Self-pay | Admitting: Urgent Care

## 2014-11-01 ENCOUNTER — Emergency Department (HOSPITAL_COMMUNITY)
Admission: EM | Admit: 2014-11-01 | Discharge: 2014-11-01 | Disposition: A | Discharge status: Home / Self Care | Payer: BC Managed Care – PPO | Attending: Emergency Medicine | Admitting: Emergency Medicine

## 2014-11-01 ENCOUNTER — Encounter (HOSPITAL_COMMUNITY): Payer: Self-pay | Admitting: Emergency Medicine

## 2014-11-01 DIAGNOSIS — Z8669 Personal history of other diseases of the nervous system and sense organs: Secondary | ICD-10-CM | POA: Insufficient documentation

## 2014-11-01 DIAGNOSIS — R42 Dizziness and giddiness: Secondary | ICD-10-CM

## 2014-11-01 DIAGNOSIS — Z79899 Other long term (current) drug therapy: Secondary | ICD-10-CM | POA: Diagnosis not present

## 2014-11-01 DIAGNOSIS — Z8619 Personal history of other infectious and parasitic diseases: Secondary | ICD-10-CM | POA: Diagnosis not present

## 2014-11-01 DIAGNOSIS — R002 Palpitations: Secondary | ICD-10-CM | POA: Insufficient documentation

## 2014-11-01 DIAGNOSIS — R251 Tremor, unspecified: Secondary | ICD-10-CM | POA: Diagnosis present

## 2014-11-01 DIAGNOSIS — Z792 Long term (current) use of antibiotics: Secondary | ICD-10-CM | POA: Insufficient documentation

## 2014-11-01 DIAGNOSIS — Z880 Allergy status to penicillin: Secondary | ICD-10-CM | POA: Insufficient documentation

## 2014-11-01 LAB — BASIC METABOLIC PANEL WITH GFR
Anion gap: 10 (ref 5–15)
BUN: 8 mg/dL (ref 6–20)
CO2: 25 mmol/L (ref 22–32)
Calcium: 9.4 mg/dL (ref 8.9–10.3)
Chloride: 108 mmol/L (ref 101–111)
Creatinine, Ser: 0.63 mg/dL (ref 0.44–1.00)
GFR calc Af Amer: >60 mL/min
GFR calc non Af Amer: >60 mL/min
Glucose, Bld: 110 mg/dL — ABNORMAL HIGH (ref 65–99)
Potassium: 3.6 mmol/L (ref 3.5–5.1)
Sodium: 143 mmol/L (ref 135–145)

## 2014-11-01 LAB — T4, FREE: Free T4: 1.12 ng/dL (ref 0.61–1.12)

## 2014-11-01 LAB — TSH: TSH: 4.123 u[IU]/mL (ref 0.350–4.500)

## 2014-11-01 NOTE — ED Provider Notes (Signed)
CSN: [id number]     Arrival date & time 11/01/14  0045 History   First MD Initiated Contact with Patient 11/01/14 0132     Chief Complaint  Patient presents with  . shakiness      (Consider location/radiation/quality/duration/timing/severity/associated sxs/prior Treatment) HPI Comments: She woke from sleep with a feeling of shakiness and slight dizziness. No pain. No SOB, nausea or vomiting. The episode lasted for one hour. She felt some palpitations. No history of similar symptoms. She reports she saw her PCP for near syncope 2 weeks ago and has not had any further similar symptoms since.   The history is provided by the patient and the spouse. No language interpreter was used.    Past Medical History  Diagnosis Date  . History of chicken pox   . Headache(784.0)     tension  . Heartburn in pregnancy    Past Surgical History  Procedure Laterality Date  . Wisdom tooth extraction    . Cesarean section  07/10/2011    Procedure: CESAREAN SECTION;  Surgeon: Margarette Asal, MD;  Location: [facility];  Service: Gynecology;  Laterality: N/A;  Primary Cesarean Section Delivery Baby Girl @ [phone], Apgars 9/9  . Cesarean section N/A 08/04/2012    Procedure: CESAREAN SECTION;  Surgeon: Marylynn Pearson, MD;  Location: [facility];  Service: Obstetrics;  Laterality: N/A;  REPEAT   Family History  Problem Relation Age of Onset  . Hypertension Father   . Hiatal hernia Father   . Healthy Mother   . Healthy Sister   . Hypertension Maternal Grandfather   . High Cholesterol Maternal Grandfather    History  Substance Use Topics  . Smoking status: Never Smoker   . Smokeless tobacco: Never Used  . Alcohol Use: No   OB History    Gravida Para Term Preterm AB TAB SAB Ectopic Multiple Living   2 2 2       2      Review of Systems  Constitutional: Negative for fever and chills.  HENT: Negative.   Respiratory: Negative.  Negative for shortness of breath.   Cardiovascular: Positive for palpitations.   Gastrointestinal: Negative.   Musculoskeletal: Negative.   Skin: Negative.   Neurological: Positive for tremors.      Allergies  Penicillins  Home Medications   Prior to Admission medications   Medication Sig Start Date End Date Taking? Authorizing Provider  azithromycin (ZITHROMAX) 250 MG tablet Two tablets daily x 1 day then one tablet daily x 4 days Patient not taking: Reported on 11/01/2014 07/06/14   Wardell Honour, MD  ipratropium (ATROVENT) 0.03 % nasal spray Place 2 sprays into the nose 2 (two) times daily. Patient not taking: Reported on 11/01/2014 07/06/14   Wardell Honour, MD   BP 114/63 mmHg  Pulse 72  Temp(Src) 98.7 F (37.1 C) (Oral)  Resp 15  Ht 5\' 7"  (1.702 m)  Wt 146 lb (66.225 kg)  BMI 22.86 kg/m2  SpO2 96%  LMP 10/25/2014 (Approximate) Physical Exam  Constitutional: She is oriented to person, place, and time. She appears well-developed and well-nourished.  HENT:  Head: Normocephalic.  Neck: Normal range of motion. Neck supple. No thyromegaly present.  Cardiovascular: Normal rate and regular rhythm.   No murmur heard. Pulmonary/Chest: Effort normal and breath sounds normal.  Abdominal: Soft. Bowel sounds are normal. There is no tenderness. There is no rebound and no guarding.  Musculoskeletal: Normal range of motion.  Neurological: She is alert and oriented to person, place,  and time.  Skin: Skin is warm and dry. No rash noted.  Psychiatric: She has a normal mood and affect.    ED Course  Procedures (including critical care time) Labs Review Labs Reviewed  BASIC METABOLIC PANEL - Abnormal; Notable for the following:    Glucose, Bld 110 (*)    All other components within normal limits  TSH  T4, FREE   Results for orders placed or performed during the hospital encounter of 45/40/98  Basic metabolic panel  Result Value Ref Range   Sodium 143 135 - 145 mmol/L   Potassium 3.6 3.5 - 5.1 mmol/L   Chloride 108 101 - 111 mmol/L   CO2 25 22 - 32  mmol/L   Glucose, Bld 110 (H) 65 - 99 mg/dL   BUN 8 6 - 20 mg/dL   Creatinine, Ser 0.63 0.44 - 1.00 mg/dL   Calcium 9.4 8.9 - 10.3 mg/dL   GFR calc non Af Amer >60 >60 mL/min   GFR calc Af Amer >60 >60 mL/min   Anion gap 10 5 - 15  TSH  Result Value Ref Range   TSH 4.123 0.350 - 4.500 uIU/mL    Imaging Review No results found.   EKG Interpretation   Date/Time:  Friday Nov 01 2014 00:58:33 EDT Ventricular Rate:  102 PR Interval:  218 QRS Duration: 109 QT Interval:  348 QTC Calculation: 453 R Axis:   78 Text Interpretation:  Age not entered, assumed to be  34 years old for  purpose of ECG interpretation Sinus tachycardia Prolonged PR interval LAE,  consider biatrial enlargement RSR' in V1 or V2, right VCD or RVH Confirmed  by Lita Mains  MD, DAVID ([id number]) on 11/01/2014 4:08:46 AM      MDM   Final diagnoses:  None    1. Tremulous 2. Dizziness  Arrived tachycardic with resolution of tachycardia in the emergency department. No pain. Thyroid panel done for PCP follow up. Will refer to cardiology given tachycardia, dizziness, palpitations.     Charlann Lange, PA-C 11/01/14 0410  Julianne Rice, MD 11/01/14 [phone]

## 2014-11-01 NOTE — ED Notes (Signed)
Pt. Ambulated down the hall and back to her room on 96% room air and heart rate 75. PA,Shari made aware.

## 2014-11-01 NOTE — ED Notes (Signed)
Patient states she was at home and unable to sleep. Patient states she was feeling shakey and is now dizzy. Patient states she was seen at PCP for dizziness last week, was unable to get a specific reason. Patient states at home her BP was high and unable to read. BP normal on arrival to ER.

## 2014-11-01 NOTE — Discharge Instructions (Signed)

## 2014-11-01 NOTE — ED Notes (Signed)
Bed: JY11 Expected date:  Expected time:  Means of arrival:  Comments: tr1

## 2014-12-02 ENCOUNTER — Other Ambulatory Visit: Payer: Self-pay | Admitting: Obstetrics and Gynecology

## 2014-12-03 LAB — CYTOLOGY - PAP

## 2014-12-25 ENCOUNTER — Ambulatory Visit (INDEPENDENT_AMBULATORY_CARE_PROVIDER_SITE_OTHER): Payer: BC Managed Care – PPO | Admitting: Cardiology

## 2014-12-25 ENCOUNTER — Encounter: Payer: Self-pay | Admitting: Cardiology

## 2014-12-25 VITALS — BP 120/74 | HR 74 | Ht 67.0 in | Wt 148.0 lb

## 2014-12-25 DIAGNOSIS — R072 Precordial pain: Secondary | ICD-10-CM

## 2014-12-25 DIAGNOSIS — R079 Chest pain, unspecified: Secondary | ICD-10-CM | POA: Insufficient documentation

## 2014-12-25 MED ORDER — PROPRANOLOL HCL 10 MG PO TABS: 10.0000 mg | ORAL_TABLET | Freq: Three times a day (TID) | ORAL | Status: DC | PRN

## 2014-12-25 NOTE — Progress Notes (Signed)
Cardiology Office Note   Date:  12/25/2014   ID:  Samantha Espinoza, DOB [birth year], MRN [id number]  PCP:  Reginia Forts, MD  Cardiologist:   Minus Breeding, MD   Chief Complaint  Patient presents with  . Chest Pain  . Shortness of Breath      History of Present Illness: Samantha Espinoza is a 34 y.o. female who presents for evaluation of dizziness and palpitations. This happened first in May. She will feel dizziness particularly at night in bed. It wasn't clear that she was having any problems with her blood sugar this has never been documented. One night she had a severe episode and her heart rate went to 140. Her blood pressure shot up. She did go to the emergency room and I have these records. She had some sinus tachycardia at the time was recorded. The highest heart rate noted was in the 120s. Her blood pressure was elevated. Home she said it was in the 180s over mid 90s. He had come down somewhat in the ER. Her thyroid and electrolytes were normal. She had another episode less severe and eventually had a head CT which was normal. She continued to have some episodes of dizziness. She will get some fleeting sharp chest pain. She would have palpitations but this would not be a primary component. There was some mild tachycardia arrhythmia. She still occasionally gets skipped beats. However, she stopped drinking vitamin water which she was drinking too great degree and her symptoms have almost completely gone away except the palpitations. She's not exercising but she takes care of 2 small children. With this she denies any cardiovascular symptoms. She doesn't reproduce chest pressure have neck or arm discomfort, shortness of breath or PND.  Past Medical History  Diagnosis Date  . History of chicken pox   . Headache(784.0)     tension  . Heartburn in pregnancy     Past Surgical History  Procedure Laterality Date  . Wisdom tooth extraction    . Cesarean section  07/10/2011   Procedure: CESAREAN SECTION;  Surgeon: Margarette Asal, MD;  Location: [facility];  Service: Gynecology;  Laterality: N/A;  Primary Cesarean Section Delivery Baby Girl @ [phone], Apgars 9/9  . Cesarean section N/A 08/04/2012    Procedure: CESAREAN SECTION;  Surgeon: Marylynn Pearson, MD;  Location: [facility];  Service: Obstetrics;  Laterality: N/A;  REPEAT     No current outpatient prescriptions on file.   No current facility-administered medications for this visit.    Allergies:   Penicillins    Social History:  The patient  reports that she has never smoked. She has never used smokeless tobacco. She reports that she does not drink alcohol or use illicit drugs.   Family History:  The patient's family history includes Healthy in her mother and sister; Hiatal hernia in her father; High Cholesterol in her maternal grandfather; Hypertension in her father and maternal grandfather.    ROS:  Please see the history of present illness.   Otherwise, review of systems are positive for none.   All other systems are reviewed and negative.    PHYSICAL EXAM: VS:  BP 120/74 mmHg  Pulse 74  Ht 5\' 7"  (1.702 m)  Wt 148 lb (67.132 kg)  BMI 23.17 kg/m2 , BMI Body mass index is 23.17 kg/(m^2). GENERAL:  Well appearing HEENT:  Pupils equal round and reactive, fundi not visualized, oral mucosa unremarkable NECK:  No jugular venous distention, waveform within normal limits, carotid  upstroke brisk and symmetric, no bruits, no thyromegaly LYMPHATICS:  No cervical, inguinal adenopathy LUNGS:  Clear to auscultation bilaterally BACK:  No CVA tenderness CHEST:  Unremarkable HEART:  PMI not displaced or sustained,S1 and S2 within normal limits, no S3, no S4, no clicks, no rubs, no murmurs ABD:  Flat, positive bowel sounds normal in frequency in pitch, no bruits, no rebound, no guarding, no midline pulsatile mass, no hepatomegaly, no splenomegaly EXT:  2 plus pulses throughout, no edema, no cyanosis no clubbing SKIN:   No rashes no nodules NEURO:  Cranial nerves II through XII grossly intact, motor grossly intact throughout PSYCH:  Cognitively intact, oriented to person place and time    EKG:  EKG is ordered today. The ekg ordered today demonstrates Sinus rhythm, rate 74, axis within normal limits, intervals within normal limits, no acute ST-T wave changes.   Recent Labs: 04/07/2014: ALT 11; Hemoglobin 12.9; Platelets 204 11/01/2014: BUN 8; Creatinine, Ser 0.63; Potassium 3.6; Sodium 143; TSH 4.123    Lipid Panel No results found for: CHOL, TRIG, HDL, CHOLHDL, VLDL, LDLCALC, LDLDIRECT    Wt Readings from Last 3 Encounters:  12/25/14 148 lb (67.132 kg)  11/01/14 146 lb (66.225 kg)  07/01/14 155 lb (70.308 kg)      Other studies Reviewed: Additional studies/ records that were reviewed today include: ED records. Review of the above records demonstrates:  Please see elsewhere in the note.     ASSESSMENT AND PLAN:  PALPITATIONS:  These seem to have resolved for the most part. However, for the tachycardia which probably sinus tachycardia and palpitations which may be PACs or PVCs I will give her propranolol to take as needed. We also discussed the need to avoid caffeine which she still uses. We discussed possible etiologies and further workup. However, at this time I don't think monitoring for an echocardiogram would be particularly helpful.  CHEST PAIN:  Her pain is atypical.  The pretest probability of obstructive coronary disease is very low. I don't think further cardiovascular testing is indicated.  Current  medicines are reviewed at length with the patient today.  The patient does not have concerns regarding medicines.  The following changes have been made:  no change  Labs/ tests ordered today include: None  No orders of the defined types were placed in this encounter.     Disposition:   FU with me as needed.     Signed, Minus Breeding, MD  12/25/2014 8:39 AM    [place]

## 2014-12-25 NOTE — Patient Instructions (Addendum)
Your physician recommends that you schedule a follow-up appointment AS Needed  Your physician has recommended you make the following change in your medication: START Propranolol 10 mg three times a day as needed

## 2015-03-25 ENCOUNTER — Other Ambulatory Visit: Payer: Self-pay | Admitting: Nurse Practitioner

## 2015-03-25 DIAGNOSIS — N6320 Unspecified lump in the left breast, unspecified quadrant: Secondary | ICD-10-CM

## 2015-04-02 ENCOUNTER — Ambulatory Visit
Admission: RE | Admit: 2015-04-02 | Discharge: 2015-04-02 | Disposition: A | Discharge status: Home / Self Care | Payer: BC Managed Care – PPO | Source: Ambulatory Visit | Attending: Nurse Practitioner | Admitting: Nurse Practitioner

## 2015-04-02 DIAGNOSIS — N6320 Unspecified lump in the left breast, unspecified quadrant: Secondary | ICD-10-CM

## 2015-06-24 ENCOUNTER — Inpatient Hospital Stay (HOSPITAL_COMMUNITY)
Admission: AD | Admit: 2015-06-24 | Discharge: 2015-06-24 | Disposition: A | Discharge status: Home / Self Care | Payer: BC Managed Care – PPO | Source: Ambulatory Visit | Attending: Obstetrics and Gynecology | Admitting: Obstetrics and Gynecology

## 2015-06-24 ENCOUNTER — Encounter (HOSPITAL_COMMUNITY): Payer: Self-pay | Admitting: *Deleted

## 2015-06-24 DIAGNOSIS — F419 Anxiety disorder, unspecified: Secondary | ICD-10-CM | POA: Diagnosis not present

## 2015-06-24 DIAGNOSIS — K219 Gastro-esophageal reflux disease without esophagitis: Secondary | ICD-10-CM | POA: Insufficient documentation

## 2015-06-24 DIAGNOSIS — Z880 Allergy status to penicillin: Secondary | ICD-10-CM | POA: Diagnosis not present

## 2015-06-24 DIAGNOSIS — Z3A00 Weeks of gestation of pregnancy not specified: Secondary | ICD-10-CM | POA: Insufficient documentation

## 2015-06-24 DIAGNOSIS — O4690 Antepartum hemorrhage, unspecified, unspecified trimester: Secondary | ICD-10-CM | POA: Insufficient documentation

## 2015-06-24 DIAGNOSIS — O039 Complete or unspecified spontaneous abortion without complication: Secondary | ICD-10-CM | POA: Insufficient documentation

## 2015-06-24 HISTORY — DX: Anxiety disorder, unspecified: F41.9

## 2015-06-24 HISTORY — DX: Gastro-esophageal reflux disease without esophagitis: K21.9

## 2015-06-24 LAB — CBC WITH DIFFERENTIAL/PLATELET
Basophils Absolute: 0 10*3/uL (ref 0.0–0.1)
Basophils Relative: 0 %
Eosinophils Absolute: 0 10*3/uL (ref 0.0–0.7)
Eosinophils Relative: 1 %
HCT: 33.1 % — ABNORMAL LOW (ref 36.0–46.0)
Hemoglobin: 11.1 g/dL — ABNORMAL LOW (ref 12.0–15.0)
LYMPHS ABS: 2 10*3/uL (ref 0.7–4.0)
Lymphocytes Relative: 32 %
MCH: 28.8 pg (ref 26.0–34.0)
MCHC: 33.5 g/dL (ref 30.0–36.0)
MCV: 86 fL (ref 78.0–100.0)
Monocytes Absolute: 0.5 10*3/uL (ref 0.1–1.0)
Monocytes Relative: 7 %
Neutro Abs: 3.8 10*3/uL (ref 1.7–7.7)
Neutrophils Relative %: 60 %
Platelets: 227 10*3/uL (ref 150–400)
RBC: 3.85 MIL/uL — ABNORMAL LOW (ref 3.87–5.11)
RDW: 13.4 % (ref 11.5–15.5)
WBC: 6.3 10*3/uL (ref 4.0–10.5)

## 2015-06-24 NOTE — MAU Provider Note (Signed)
History     CSN: [id number]  Arrival date and time: 06/24/15 2148   First Provider Initiated Contact with Patient 06/24/15 2225      No chief complaint on file.  HPI Ms. RONALDA IVANOV is a 35 y.o. G3P2002 at Unknown who presents to MAU today with complaint of vaginal bleeding. The patient was seen in the office early last week and diagnosed with a early failed pregnancy after multiple US showed only IUGS. The patient was given Cytotec which she took on Wednesday. She states that she had minimal bleeding and cramping after that and by Friday there was only light spotting. This continued through the weekend until Sunday night when she noted an increase in bleeding. This improved by this morning. Tonight she was out to dinner and stood up and had soaked through her pad, underwear and pants with blood. She took Ibuprofen today for pain and denies pain now. She states that bleeding has improved significantly since 2100 tonight. She called the office and was told to come in.   OB History    Gravida Para Term Preterm AB TAB SAB Ectopic Multiple Living   3 2 2       2       Past Medical History  Diagnosis Date  . History of chicken pox   . GERD (gastroesophageal reflux disease)   . Anxiety     Past Surgical History  Procedure Laterality Date  . Wisdom tooth extraction    . Cesarean section  07/10/2011    Procedure: CESAREAN SECTION;  Surgeon: Margarette Asal, MD;  Location: [facility];  Service: Gynecology;  Laterality: N/A;  Primary Cesarean Section Delivery Baby Girl @ [phone], Apgars 9/9  . Cesarean section N/A 08/04/2012    Procedure: CESAREAN SECTION;  Surgeon: Marylynn Pearson, MD;  Location: [facility];  Service: Obstetrics;  Laterality: N/A;  REPEAT    Family History  Problem Relation Age of Onset  . Hypertension Father   . Hiatal hernia Father   . Healthy Mother   . Healthy Sister   . Hypertension Maternal Grandfather   . High Cholesterol Maternal Grandfather   . Heart attack  Maternal Grandfather 52  . Heart attack Maternal Grandmother 60  . AAA (abdominal aortic aneurysm) Maternal Grandmother     Social History  Substance Use Topics  . Smoking status: Never Smoker   . Smokeless tobacco: Never Used  . Alcohol Use: No    Allergies:  Allergies  Allergen Reactions  . Penicillins     Took amoxicillin and did not feel right. Never took again.    Prescriptions prior to admission  Medication Sig Dispense Refill Last Dose  . propranolol (INDERAL) 10 MG tablet Take 1 tablet (10 mg total) by mouth 3 (three) times daily as needed. 90 tablet 3     Review of Systems  Constitutional: Negative for fever and malaise/fatigue.  Gastrointestinal: Negative for abdominal pain.  Genitourinary:       + vaginal bleeding   Physical Exam   Blood pressure 138/79, pulse 94, temperature 98.3 F (36.8 C), temperature source Oral, resp. rate 16, last menstrual period 04/14/2015, SpO2 100 %.  Physical Exam  Nursing note and vitals reviewed. Constitutional: She is oriented to person, place, and time. She appears well-developed and well-nourished. No distress.  HENT:  Head: Normocephalic and atraumatic.  Cardiovascular: Normal rate.   Respiratory: Effort normal.  GI: Soft. She exhibits no distension.  Genitourinary: Cervix exhibits no motion tenderness, no discharge and  no friability. There is bleeding (small amount of bleeding in the vaginal vault) in the vagina. No vaginal discharge found.  Neurological: She is alert and oriented to person, place, and time.  Skin: Skin is warm and dry. No erythema.  Psychiatric: She has a normal mood and affect.    Results for orders placed or performed during the hospital encounter of 06/24/15 (from the past 24 hour(s))  CBC with Differential/Platelet     Status: Abnormal   Collection Time: 06/24/15 10:35 PM  Result Value Ref Range   WBC 6.3 4.0 - 10.5 K/uL   RBC 3.85 (L) 3.87 - 5.11 MIL/uL   Hemoglobin 11.1 (L) 12.0 - 15.0 g/dL    HCT 33.1 (L) 36.0 - 46.0 %   MCV 86.0 78.0 - 100.0 fL   MCH 28.8 26.0 - 34.0 pg   MCHC 33.5 30.0 - 36.0 g/dL   RDW 13.4 11.5 - 15.5 %   Platelets 227 150 - 400 K/uL   Neutrophils Relative % 60 %   Neutro Abs 3.8 1.7 - 7.7 K/uL   Lymphocytes Relative 32 %   Lymphs Abs 2.0 0.7 - 4.0 K/uL   Monocytes Relative 7 %   Monocytes Absolute 0.5 0.1 - 1.0 K/uL   Eosinophils Relative 1 %   Eosinophils Absolute 0.0 0.0 - 0.7 K/uL   Basophils Relative 0 %   Basophils Absolute 0.0 0.0 - 0.1 K/uL    MAU Course  Procedures None  MDM Quant hCG and CBC today Quant hCG pending at time of discharge. Will be accessible during office visit tomorrow morning Discussed patient with Dr. Matthew Saras. Agrees with plan for discharge. Follow-up in the office tomorrow as scheduled.   Assessment and Plan  A: SAB   P: Discharge home Continue Ibuprofen PRN for pain Bleeding precautions discussed Patient advised to follow-up with Dr. Julien Girt as scheduled tomorrow morning Patient may return to MAU as needed or if her condition were to change or worsen  Luvenia Redden, PA-C  06/24/2015, 11:00 PM

## 2015-06-24 NOTE — Discharge Instructions (Signed)
Incomplete Miscarriage A miscarriage is the sudden loss of an unborn baby (fetus) before the 20th week of pregnancy. In an incomplete miscarriage, parts of the fetus or placenta (afterbirth) remain in the body.  Having a miscarriage can be an emotional experience. Talk with your health care provider about any questions you may have about miscarrying, the grieving process, and your future pregnancy plans. CAUSES   Problems with the fetal chromosomes that make it impossible for the baby to develop normally. Problems with the baby's genes or chromosomes are most often the result of errors that occur by chance as the embryo divides and grows. The problems are not inherited from the parents.  Infection of the cervix or uterus.  Hormone problems.  Problems with the cervix, such as having an incompetent cervix. This is when the tissue in the cervix is not strong enough to hold the pregnancy.  Problems with the uterus, such as an abnormally shaped uterus, uterine fibroids, or congenital abnormalities.  Certain medical conditions.  Smoking, drinking alcohol, or taking illegal drugs.  Trauma. SYMPTOMS   Vaginal bleeding or spotting, with or without cramps or pain.  Pain or cramping in the abdomen or lower back.  Passing fluid, tissue, or blood clots from the vagina. DIAGNOSIS  Your health care provider will perform a physical exam. You may also have an ultrasound to confirm the miscarriage. Blood or urine tests may also be ordered. TREATMENT   Usually, a dilation and curettage (D&C) procedure is performed. During a D&C procedure, the cervix is widened (dilated) and any remaining fetal or placental tissue is gently removed from the uterus.  Antibiotic medicines are prescribed if there is an infection. Other medicines may be given to reduce the size of the uterus (contract) if there is a lot of bleeding.  If you have Rh negative blood and your baby was Rh positive, you will need a Rho (D)  immune globulin shot. This shot will protect any future baby from having Rh blood problems in future pregnancies.  You may be confined to bed rest. This means you should stay in bed and only get up to use the bathroom. HOME CARE INSTRUCTIONS   Rest as directed by your health care provider.  Restrict activity as directed by your health care provider. You may be allowed to continue light activity if curettage was not done but you require further treatment.  Keep track of the number of pads you use each day. Keep track of how soaked (saturated) they are. Record this information.  Do not  use tampons.  Do not douche or have sexual intercourse until approved by your health care provider.  Keep all follow-up appointments for reevaluation and continuing management.  Only take over-the-counter or prescription medicines for pain, fever, or discomfort as directed by your health care provider.  Take antibiotic medicine as directed by your health care provider. Make sure you finish it even if you start to feel better. SEEK IMMEDIATE MEDICAL CARE IF:   You experience severe cramps in your stomach, back, or abdomen.  You have an unexplained temperature (make sure to record these temperatures).  You pass large clots or tissue (save these for your health care provider to inspect).  Your bleeding increases.  You become light-headed, weak, or have fainting episodes. MAKE SURE YOU:   Understand these instructions.  Will watch your condition.  Will get help right away if you are not doing well or get worse.   This information is not intended to   replace advice given to you by your health care provider. Make sure you discuss any questions you have with your health care provider.   Document Released: 05/31/2005 Document Revised: 06/21/2014 Document Reviewed: 12/28/2012 Elsevier Interactive Patient Education 2016 [employer]  

## 2015-06-24 NOTE — MAU Note (Signed)
Pt reports she took the pills for a miscarriage last week and had cramping and bleeding. The bleeding and cramping stopped over the weekend and then started again on Monday and tonight began bleeding very heavy. Took ibuprofen so pain is not bad.

## 2015-06-25 LAB — HCG, QUANTITATIVE, PREGNANCY: HCG, BETA CHAIN, QUANT, S: 26496 m[IU]/mL — AB (ref <5)

## 2015-08-01 ENCOUNTER — Encounter: Payer: Self-pay | Admitting: Cardiology

## 2015-08-01 ENCOUNTER — Ambulatory Visit (INDEPENDENT_AMBULATORY_CARE_PROVIDER_SITE_OTHER): Payer: BC Managed Care – PPO

## 2015-08-01 ENCOUNTER — Ambulatory Visit (INDEPENDENT_AMBULATORY_CARE_PROVIDER_SITE_OTHER): Payer: BC Managed Care – PPO | Admitting: Cardiology

## 2015-08-01 VITALS — BP 100/80 | HR 84 | Ht 67.0 in | Wt 154.4 lb

## 2015-08-01 DIAGNOSIS — R002 Palpitations: Secondary | ICD-10-CM

## 2015-08-01 DIAGNOSIS — R011 Cardiac murmur, unspecified: Secondary | ICD-10-CM

## 2015-08-01 NOTE — Patient Instructions (Signed)
Medication Instructions:   NO CHANGE  Testing/Procedures:  Your physician has requested that you have an echocardiogram. Echocardiography is a painless test that uses sound waves to create images of your heart. It provides your doctor with information about the size and shape of your heart and how well your heart's chambers and valves are working. This procedure takes approximately one hour. There are no restrictions for this procedure.   Your physician has recommended that you wear a 48 HOUR holter monitor. Holter monitors are medical devices that record the heart's electrical activity. Doctors most often use these monitors to diagnose arrhythmias. Arrhythmias are problems with the speed or rhythm of the heartbeat. The monitor is a small, portable device. You can wear one while you do your normal daily activities. This is usually used to diagnose what is causing palpitations/syncope (passing out).    Follow-Up:  Your physician recommends that you schedule a follow-up appointment in: [facility]

## 2015-08-01 NOTE — Progress Notes (Signed)
Cardiology Office Note   Date:  08/01/2015   ID:  Samantha Espinoza, DOB [birth year], MRN [id number]  PCP:  Reginia Forts, MD  Cardiologist:   Minus Breeding, MD   Chief Complaint  Patient presents with  . Chest Pain  . Palpitations      History of Present Illness: Samantha Espinoza is a 35 y.o. female who presents for evaluation chest pain and palpitations.  This is the second visit for this patient. She presented previously with episode of tachycardia palpitations and hypertension. This is discussed in the previous note. However, ever since this episode she says she's not felt well. She's had palpitations. She'll feel these episodically mostly at night. She'll feel skipped beats. She's not having any of the sustained tachycardia arrhythmias that she was having. She's not had any presyncope or syncope. She does have chest discomfort.   This is moderate.  She has this pain in her left upper chest and it is sporadic.  It feels like a weight.  It is sharp and seems to occur after eating.  She has burping.  It was slightly better when she was taking a sample of Dexilant.  The pain is slightly worse lying on her left side.    She has been under stress as her husband is working out of town.  She works full time, has two small children and takes care of them alone during the week.    Past Medical History  Diagnosis Date  . History of chicken pox   . GERD (gastroesophageal reflux disease)   . Anxiety     Past Surgical History  Procedure Laterality Date  . Wisdom tooth extraction    . Cesarean section  07/10/2011    Procedure: CESAREAN SECTION;  Surgeon: Margarette Asal, MD;  Location: [facility];  Service: Gynecology;  Laterality: N/A;  Primary Cesarean Section Delivery Baby Girl @ [phone], Apgars 9/9  . Cesarean section N/A 08/04/2012    Procedure: CESAREAN SECTION;  Surgeon: Marylynn Pearson, MD;  Location: [facility];  Service: Obstetrics;  Laterality: N/A;  REPEAT     Current Outpatient  Prescriptions  Medication Sig Dispense Refill  . dexlansoprazole (DEXILANT) 60 MG capsule Take 60 mg by mouth daily.     No current facility-administered medications for this visit.    Allergies:   Penicillins     ROS:  Please see the history of present illness.   Otherwise, review of systems are positive for none.   All other systems are reviewed and negative.    PHYSICAL EXAM: VS:  BP 100/80 mmHg  Pulse 84  Ht 5\' 7"  (1.702 m)  Wt 154 lb 7 oz (70.052 kg)  BMI 24.18 kg/m2  LMP 04/14/2015 , BMI Body mass index is 24.18 kg/(m^2). GENERAL:  Well appearing HEENT:  Pupils equal round and reactive, fundi not visualized, oral mucosa unremarkable NECK:  No jugular venous distention, waveform within normal limits, carotid upstroke brisk and symmetric, no bruits, no thyromegaly LYMPHATICS:  No cervical, inguinal adenopathy LUNGS:  Clear to auscultation bilaterally BACK:  No CVA tenderness CHEST:  Unremarkable HEART:  PMI not displaced or sustained,S1 and S2 within normal limits, no S3, no S4, no clicks, no rubs, 2/6 apical systolic murmur at the right upper sternal border and brief with no diastolic murmurs ABD:  Flat, positive bowel sounds normal in frequency in pitch, no bruits, no rebound, no guarding, no midline pulsatile mass, no hepatomegaly, no splenomegaly EXT:  2 plus pulses throughout, no  edema, no cyanosis no clubbing SKIN:  No rashes no nodules NEURO:  Cranial nerves II through XII grossly intact, motor grossly intact throughout PSYCH:  Cognitively intact, oriented to person place and time    EKG:  EKG is ordered today. The ekg ordered today demonstrates Sinus rhythm, rate 84, axis within normal limits, intervals within normal limits, no acute ST-T wave changes.   Recent Labs: 11/01/2014: BUN 8; Creatinine, Ser 0.63; Potassium 3.6; Sodium 143; TSH 4.123 06/24/2015: Hemoglobin 11.1*; Platelets 227    Lipid Panel No results found for: CHOL, TRIG, HDL, CHOLHDL, VLDL,  LDLCALC, LDLDIRECT    Wt Readings from Last 3 Encounters:  08/01/15 154 lb 7 oz (70.052 kg)  12/25/14 148 lb (67.132 kg)  11/01/14 146 lb (66.225 kg)      Other studies Reviewed: Additional studies/ records that were reviewed today include: None    ASSESSMENT AND PLAN:  PALPITATIONS:  Because these are now recurrent and bothersome I will order a 48-hour Holter.  CHEST PAIN:  Her pain is atypical.  The pretest probability of obstructive coronary disease is very low. I don't think further cardiovascular testing is indicated.  MURMUR:  I suspect a flow murmur but I will order an echocardiogram.    Current  medicines are reviewed at length with the patient today.  The patient does not have concerns regarding medicines.  The following changes have been made:  no change  Labs/ tests ordered today include: None   Orders Placed This Encounter  Procedures  . Holter monitor - 48 hour  . EKG 12-Lead  . Echocardiogram     Disposition:   FU with me after these studies.     Signed, Minus Breeding, MD  08/01/2015 4:59 PM    [facility]

## 2015-08-15 ENCOUNTER — Other Ambulatory Visit (HOSPITAL_COMMUNITY): Payer: BC Managed Care – PPO

## 2015-08-26 ENCOUNTER — Other Ambulatory Visit: Payer: Self-pay

## 2015-08-26 ENCOUNTER — Ambulatory Visit (HOSPITAL_COMMUNITY): Payer: BC Managed Care – PPO | Attending: Internal Medicine

## 2015-08-26 DIAGNOSIS — I340 Nonrheumatic mitral (valve) insufficiency: Secondary | ICD-10-CM | POA: Insufficient documentation

## 2015-08-26 DIAGNOSIS — I071 Rheumatic tricuspid insufficiency: Secondary | ICD-10-CM | POA: Diagnosis not present

## 2015-08-26 DIAGNOSIS — R011 Cardiac murmur, unspecified: Secondary | ICD-10-CM | POA: Diagnosis not present

## 2015-09-02 ENCOUNTER — Telehealth: Payer: Self-pay | Admitting: Family Medicine

## 2015-09-02 NOTE — Telephone Encounter (Signed)
Spoke with patient about flu shot.  She informed me that she does not use [place] for her primary care and does not come here for any care any more.

## 2015-09-03 NOTE — Progress Notes (Signed)
Cardiology Office Note   Date:  09/04/2015   ID:  Samantha Espinoza, DOB [birth year], MRN [id number]  PCP:  No primary care provider on file.  Cardiologist:   Minus Breeding, MD   No chief complaint on file.     History of Present Illness: Samantha Espinoza is a 35 y.o. female who presents for evaluation chest pain and palpitations.  These have been described extensively. She did have a slight murmur but an echocardiogram suggested this was a flow murmur as a were no structural abnormalities. She did wear a Holter monitor and there was infrequent ventricular ectopy. There was one 4 beat run it looked like it was likely atrial arrhythmia with aberrancy and less likely nonsustained ventricular tachycardia. She says that since she last saw me she is doing much better. Chest pain that she was having has improved with excellent. She is rarely having palpitations. She has no chest pressure, neck or arm discomfort. She's had no presyncope or syncope. She has no new shortness of breath.  Past Medical History  Diagnosis Date  . History of chicken pox   . GERD (gastroesophageal reflux disease)   . Anxiety     Past Surgical History  Procedure Laterality Date  . Wisdom tooth extraction    . Cesarean section  07/10/2011    Procedure: CESAREAN SECTION;  Surgeon: Margarette Asal, MD;  Location: [facility];  Service: Gynecology;  Laterality: N/A;  Primary Cesarean Section Delivery Baby Girl @ [phone], Apgars 9/9  . Cesarean section N/A 08/04/2012    Procedure: CESAREAN SECTION;  Surgeon: Marylynn Pearson, MD;  Location: [facility];  Service: Obstetrics;  Laterality: N/A;  REPEAT     Current Outpatient Prescriptions  Medication Sig Dispense Refill  . dexlansoprazole (DEXILANT) 60 MG capsule Take 60 mg by mouth daily.    . Prenatal Vit-Fe Fumarate-FA (PRENATAL MULTIVITAMIN) TABS tablet Take 1 tablet by mouth daily at 12 noon.     No current facility-administered medications for this visit.     Allergies:   Penicillins     ROS:  Please see the history of present illness.   Otherwise, review of systems are positive for none.   All other systems are reviewed and negative.    PHYSICAL EXAM: VS:  BP 120/72 mmHg  Pulse 72  Ht 5\' 7"  (1.702 m)  Wt 157 lb (71.215 kg)  BMI 24.58 kg/m2  LMP 04/14/2015 , BMI Body mass index is 24.58 kg/(m^2). GENERAL:  Well appearing HEENT:  Pupils equal round and reactive, fundi not visualized, oral mucosa unremarkable NECK:  No jugular venous distention, waveform within normal limits, carotid upstroke brisk and symmetric, no bruits, no thyromegaly LYMPHATICS:  No cervical, inguinal adenopathy LUNGS:  Clear to auscultation bilaterally BACK:  No CVA tenderness CHEST:  Unremarkable HEART:  PMI not displaced or sustained,S1 and S2 within normal limits, no S3, no S4, no clicks, no rubs, 2/6 apical systolic murmur at the right upper sternal border and brief with no diastolic murmurs ABD:  Flat, positive bowel sounds normal in frequency in pitch, no bruits, no rebound, no guarding, no midline pulsatile mass, no hepatomegaly, no splenomegaly EXT:  2 plus pulses throughout, no edema, no cyanosis no clubbing SKIN:  No rashes no nodules NEURO:  Cranial nerves II through XII grossly intact, motor grossly intact throughout PSYCH:  Cognitively intact, oriented to person place and time    EKG:  EKG is ordered today. The ekg ordered today demonstrates Sinus rhythm, rate  84, axis within normal limits, intervals within normal limits, no acute ST-T wave changes.   Recent Labs: 11/01/2014: BUN 8; Creatinine, Ser 0.63; Potassium 3.6; Sodium 143; TSH 4.123 06/24/2015: Hemoglobin 11.1*; Platelets 227    Lipid Panel No results found for: CHOL, TRIG, HDL, CHOLHDL, VLDL, LDLCALC, LDLDIRECT    Wt Readings from Last 3 Encounters:  09/04/15 157 lb (71.215 kg)  08/01/15 154 lb 7 oz (70.052 kg)  12/25/14 148 lb (67.132 kg)      Other studies  Reviewed: Additional studies/ records that were reviewed today include: None    ASSESSMENT AND PLAN:  PALPITATIONS:  She did have some rare ectopy. However, this seems to have settled down. She's had normal echocardiogram. She has had normal electrolytes and TSH. This point I discussed when necessary use of beta blocker and give her prescription for low-dose of this.  CHEST PAIN:  Her pain is atypical.  It has improved with Dexilant.  She will continue on this.    The pretest probability of obstructive coronary disease is very low. I don't think further cardiovascular testing is indicated.  MURMUR:  I suspect a flow murmur.  The echo was normal.    Current  medicines are reviewed at length with the patient today.  The patient does not have concerns regarding medicines.  The following changes have been made:  no change  Labs/ tests ordered today include: None   No orders of the defined types were placed in this encounter.     Disposition:   FU with me as needed.    Signed, Minus Breeding, MD  09/04/2015 4:16 PM    [facility]

## 2015-09-04 ENCOUNTER — Encounter: Payer: Self-pay | Admitting: Cardiology

## 2015-09-04 ENCOUNTER — Ambulatory Visit (INDEPENDENT_AMBULATORY_CARE_PROVIDER_SITE_OTHER): Payer: BC Managed Care – PPO | Admitting: Cardiology

## 2015-09-04 VITALS — BP 120/72 | HR 72 | Ht 67.0 in | Wt 157.0 lb

## 2015-09-04 DIAGNOSIS — R002 Palpitations: Secondary | ICD-10-CM | POA: Diagnosis not present

## 2015-09-04 MED ORDER — METOPROLOL TARTRATE 25 MG PO TABS: 25.0000 mg | ORAL_TABLET | ORAL | Status: AC | PRN

## 2015-09-04 NOTE — Patient Instructions (Signed)
Your physician recommends that you schedule a follow-up appointment in: As Needed  Your physician has recommended you make the following change in your medication: Take Metoprolol Tart 25 mg as needed

## 2015-10-24 ENCOUNTER — Other Ambulatory Visit: Payer: Self-pay | Admitting: Family Medicine

## 2015-10-24 ENCOUNTER — Other Ambulatory Visit (HOSPITAL_COMMUNITY): Payer: Self-pay | Admitting: Interventional Radiology

## 2015-10-24 ENCOUNTER — Ambulatory Visit
Admission: RE | Admit: 2015-10-24 | Discharge: 2015-10-24 | Disposition: A | Discharge status: Home / Self Care | Payer: BC Managed Care – PPO | Source: Ambulatory Visit | Attending: Family Medicine | Admitting: Family Medicine

## 2015-10-24 DIAGNOSIS — R519 Headache, unspecified: Secondary | ICD-10-CM

## 2015-10-24 DIAGNOSIS — R51 Headache: Principal | ICD-10-CM

## 2016-12-28 IMAGING — CT CT HEAD W/O CM
1 series · 16 of 27 positions shown, 20 images · non-contrast
Comparison: 11/06/2014

CLINICAL DATA: Dizziness

EXAM:
CT HEAD WITHOUT CONTRAST
TECHNIQUE: Contiguous axial images were obtained from the base of the skull
through the vertex without intravenous contrast.

[Series 2: head w/(date) · axial · 0.45mm/px · z∈[-140,-20]mm · 16 of 27 slices shown, 20 images]
[im 2/27  brain]
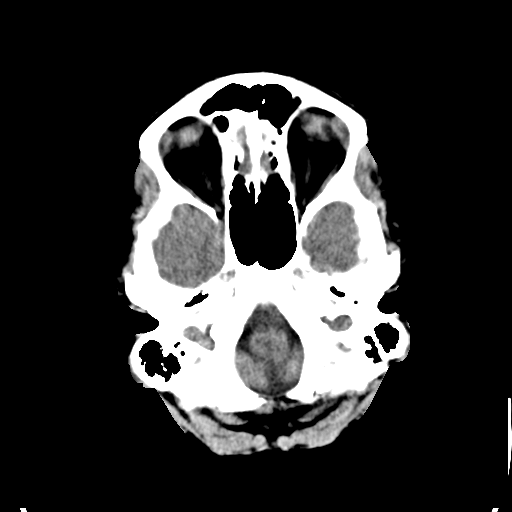
[im 2/27  bone]
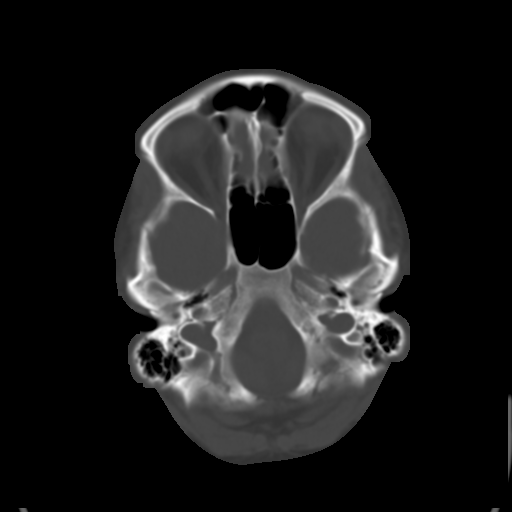
[im 4/27  brain]
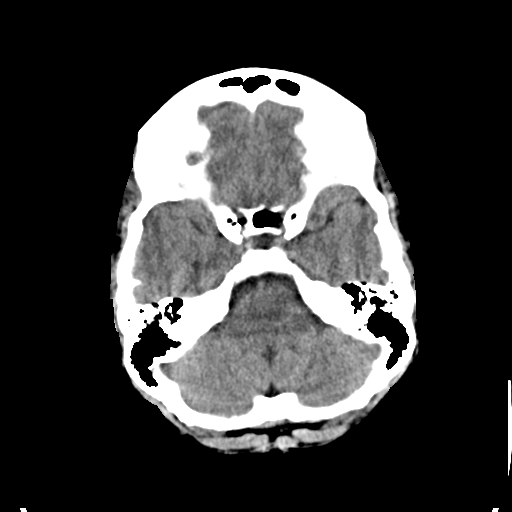
[im 5/27  brain]
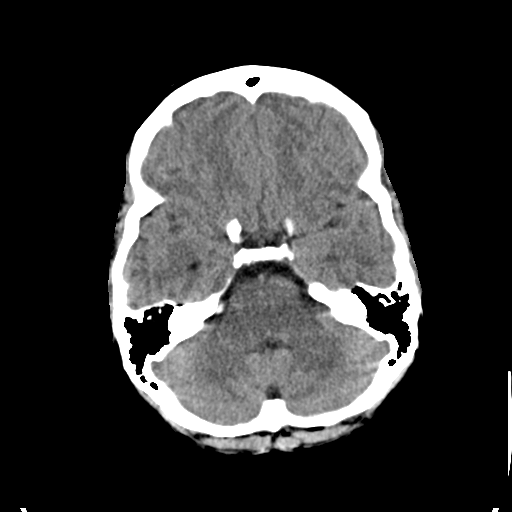
[im 7/27  brain]
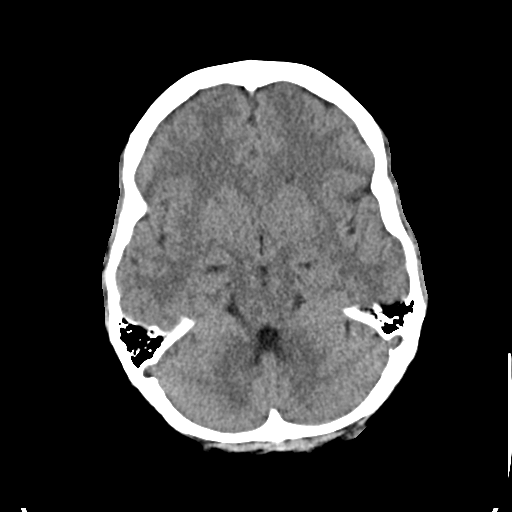
[im 9/27  brain]
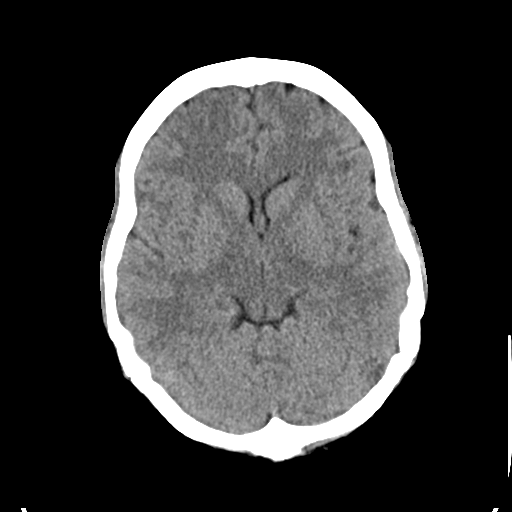
[im 9/27  bone]
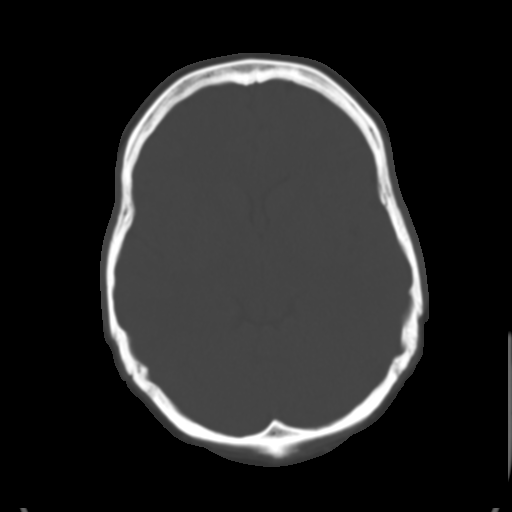
[im 10/27  brain]
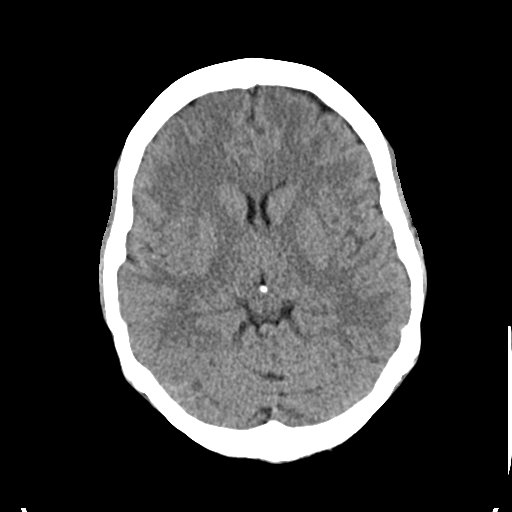
[im 12/27  brain]
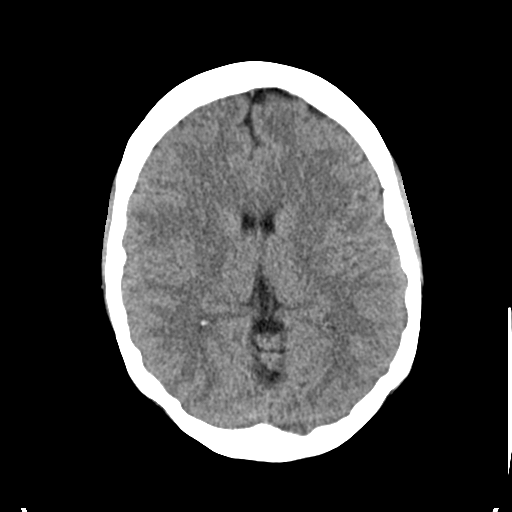
[im 13/27  brain]
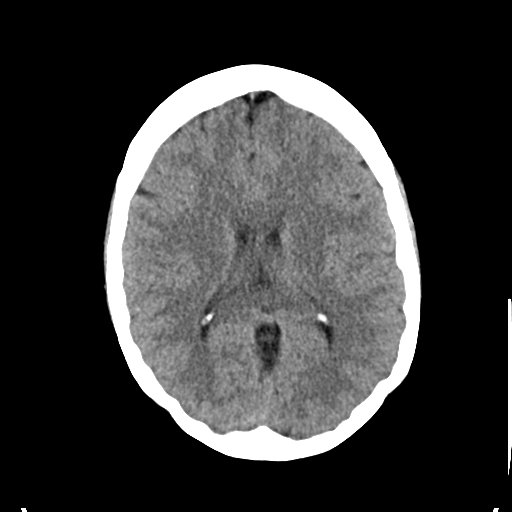
[im 15/27  brain]
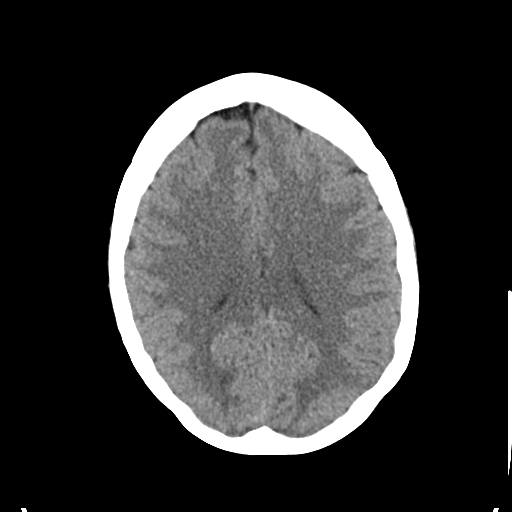
[im 15/27  bone]
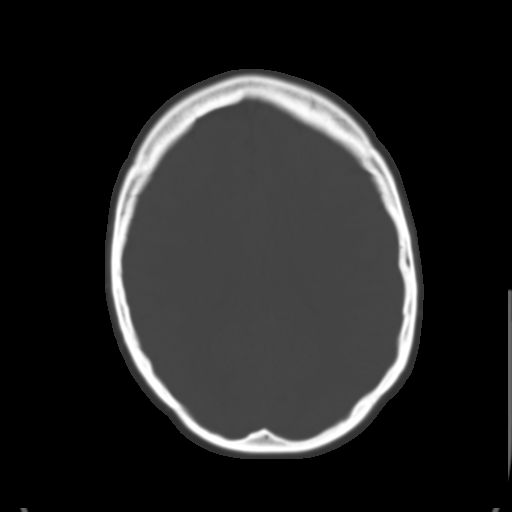
[im 16/27  brain]
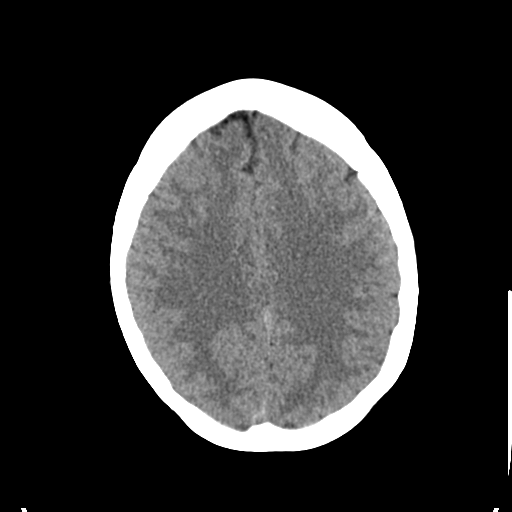
[im 18/27  brain]
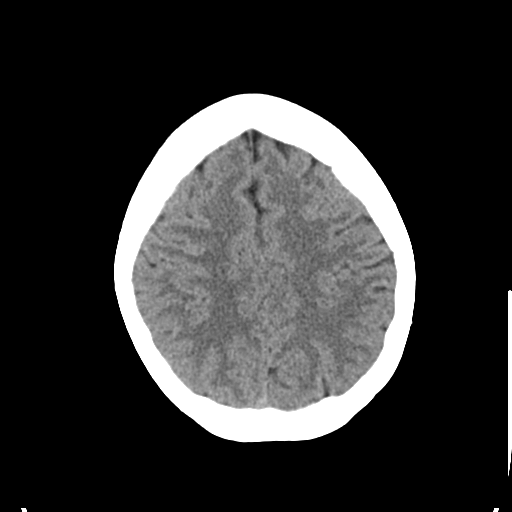
[im 19/27  brain]
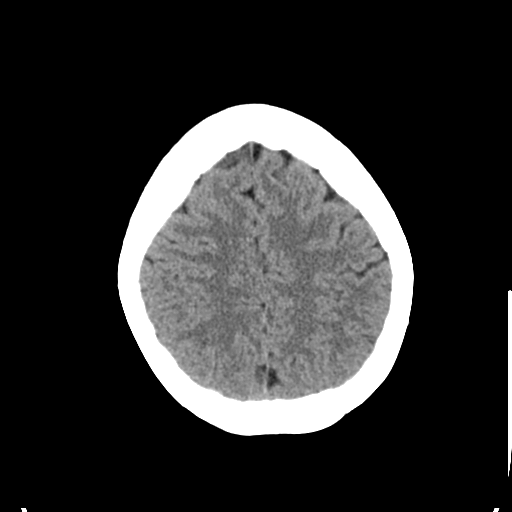
[im 21/27  brain]
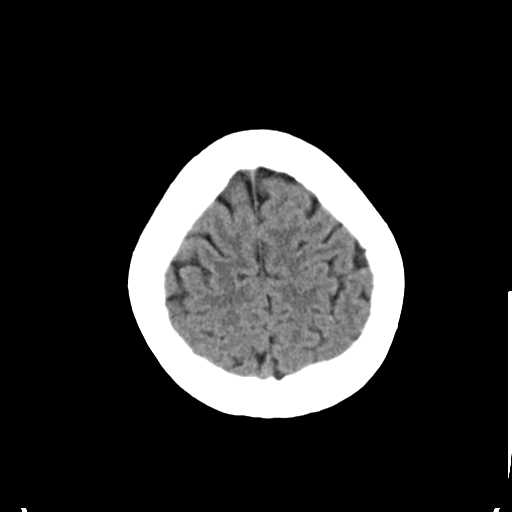
[im 21/27  bone]
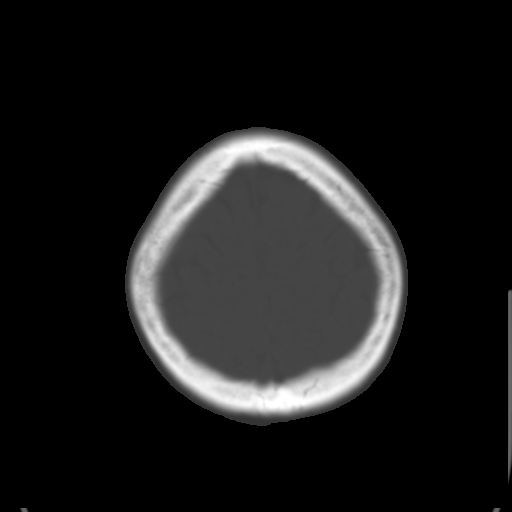
[im 23/27  brain]
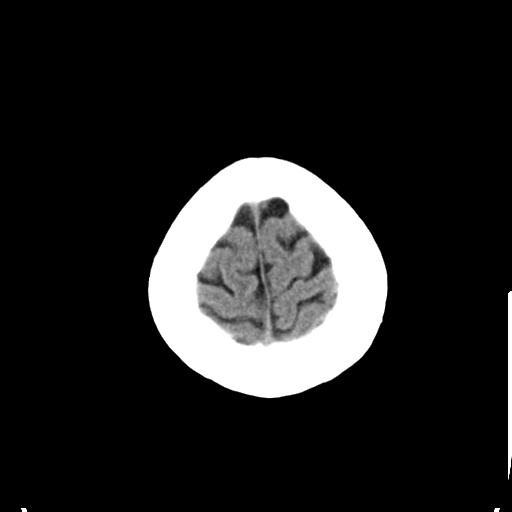
[im 24/27  brain]
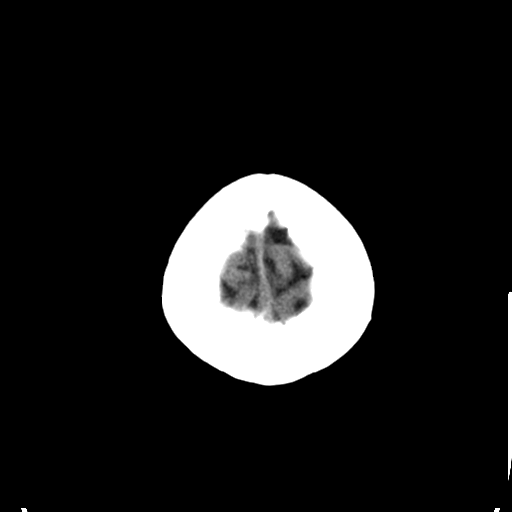
[im 26/27  brain]
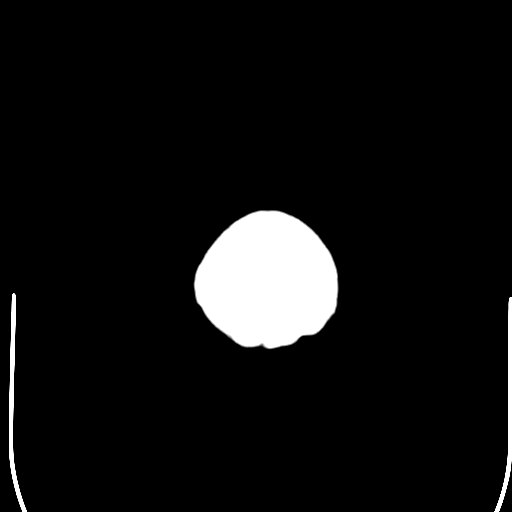

[16 of 27 positions shown; findings below may reference images not displayed]

FINDINGS: The bony calvarium is intact. The ventricles are of normal size and
configuration. No findings to suggest acute hemorrhage, acute
infarction or space-occupying mass lesion are noted.
IMPRESSION: No acute intracranial abnormality noted.
